# Patient Record
Sex: Female | Born: 1960
Health system: Southern US, Community
[De-identification: ages and names within clinical notes are randomized; demographics above are authoritative.]

## PROBLEM LIST (undated history)

## (undated) DIAGNOSIS — F1021 Alcohol dependence, in remission: Secondary | ICD-10-CM

## (undated) DIAGNOSIS — M797 Fibromyalgia: Secondary | ICD-10-CM

## (undated) HISTORY — DX: Fibromyalgia: M79.7

## (undated) HISTORY — DX: Alcohol dependence, in remission: F10.21

---

## 1998-07-10 ENCOUNTER — Ambulatory Visit (HOSPITAL_COMMUNITY): Admission: RE | Admit: 1998-07-10 | Discharge: 1998-07-10 | Payer: Self-pay | Admitting: Family Medicine

## 1998-07-10 ENCOUNTER — Encounter: Payer: Self-pay | Admitting: Family Medicine

## 2000-02-28 ENCOUNTER — Encounter: Admission: RE | Admit: 2000-02-28 | Discharge: 2000-02-28 | Payer: Self-pay | Admitting: Family Medicine

## 2000-02-28 ENCOUNTER — Encounter: Payer: Self-pay | Admitting: Family Medicine

## 2000-04-03 ENCOUNTER — Encounter: Payer: Self-pay | Admitting: Family Medicine

## 2000-04-03 ENCOUNTER — Encounter: Admission: RE | Admit: 2000-04-03 | Discharge: 2000-04-03 | Payer: Self-pay | Admitting: Family Medicine

## 2004-12-29 ENCOUNTER — Other Ambulatory Visit: Admission: RE | Admit: 2004-12-29 | Discharge: 2004-12-29 | Payer: Self-pay | Admitting: Family Medicine

## 2007-01-22 ENCOUNTER — Other Ambulatory Visit: Admission: RE | Admit: 2007-01-22 | Discharge: 2007-01-22 | Payer: Self-pay | Admitting: Family Medicine

## 2015-02-11 ENCOUNTER — Ambulatory Visit (INDEPENDENT_AMBULATORY_CARE_PROVIDER_SITE_OTHER): Payer: 59 | Admitting: Family Medicine

## 2015-02-11 VITALS — BP 92/62 | HR 81 | Temp 98.4°F | Resp 16 | Ht 60.0 in | Wt 105.8 lb

## 2015-02-11 DIAGNOSIS — B029 Zoster without complications: Secondary | ICD-10-CM | POA: Diagnosis not present

## 2015-02-11 MED ORDER — VALACYCLOVIR HCL 1 G PO TABS: 1000.0000 mg | ORAL_TABLET | Freq: Three times a day (TID) | ORAL | Status: DC

## 2015-02-11 MED ORDER — GABAPENTIN 300 MG PO CAPS: 300.0000 mg | ORAL_CAPSULE | Freq: Three times a day (TID) | ORAL | Status: DC | PRN

## 2015-02-11 MED ORDER — PREDNISONE 20 MG PO TABS: ORAL_TABLET | ORAL | Status: DC

## 2015-02-11 NOTE — Progress Notes (Addendum)
Subjective:  This chart was scribed for Ethelda Chick, MD, MD by Andrew Au, ED Scribe. This patient was seen in room 2 and the patient's care was started at 4:39 PM.   Patient ID: Monique Johnston, female    DOB: August 27, 1960, 54 y.o.   MRN: [id number]  HPI   Chief Complaint  Patient presents with  . Rash    back, since monday   HPI Comments: Monique Johnston is a 54 y.o. female who presents to the [facility] complaining of a rash on her upper back for 3 days. Pt reports 2 days prior to developing the rash she had some soreness to her upper back. She describes the rash to be painful with itchiness, burning and tingling but is not in pain at this time. Pt has not treated rash with OTC medication.  She has been stressed but is emotionally stable. Pt has had exposure to shingle from a coworker at work. Pt lives with her 85 y.o. Mother; pt is supposed to meet with a pregnant women tomorrow.    There are no active problems to display for this patient.  Past Medical History  Diagnosis Date  . Fibromyalgia   . Alcoholism in remission    Prior to Admission medications   Not on File  History reviewed. No pertinent past surgical history. No Known Allergies Social History   Social History  . Marital Status: Married    Spouse Name: N/A  . Number of Children: N/A  . Years of Education: N/A   Occupational History  . Not on file.   Social History Main Topics  . Smoking status: Never Smoker   . Smokeless tobacco: Not on file  . Alcohol Use: No  . Drug Use: Not on file  . Sexual Activity: Not on file   Other Topics Concern  . Not on file   Social History Narrative  . No narrative on file   Family History  Problem Relation Age of Onset  . Cancer Father   . Cancer Sister     Review of Systems  Constitutional: Positive for chills, diaphoresis and fatigue. Negative for fever.  Skin: Positive for color change and rash.  Neurological: Positive for headaches. Negative  for dizziness and light-headedness.   Objective:   Physical Exam  Constitutional: She is oriented to person, place, and time. She appears well-developed and well-nourished. No distress.  HENT:  Head: Normocephalic and atraumatic.  Eyes: Conjunctivae and EOM are normal.  Neck: Neck supple.  Cardiovascular: Normal rate and regular rhythm.   No murmur heard. Pulmonary/Chest: Effort normal and breath sounds normal. No respiratory distress. She has no wheezes. She has no rales.  Musculoskeletal: Normal range of motion.  Neurological: She is alert and oriented to person, place, and time.  Skin: Skin is warm and dry. Rash noted. Rash is vesicular. She is not diaphoretic.  Vesicular rash in cluster on thoracic spine that starts on the right thoracic region and extends to right breast.   Psychiatric: She has a normal mood and affect. Her behavior is normal.  Nursing note and vitals reviewed.  Filed Vitals:   02/11/15 1608  BP: 92/62  Pulse: 81  Temp: 98.4 F (36.9 C)  TempSrc: Oral  Resp: 16  Height: 5' (1.524 m)  Weight: 105 lb 12.8 oz (47.991 kg)  SpO2: 99%   Assessment & Plan:   1. Herpes zoster    -New. -Rx for Valtrex tid, Prednisone provided. -Rx  for Neurontin 300mg  tid PRN for pain.   No orders of the defined types were placed in this encounter.    Meds ordered this encounter  Medications  . valACYclovir (VALTREX) 1000 MG tablet    Sig: Take 1 tablet (1,000 mg total) by mouth 3 (three) times daily.    Dispense:  30 tablet    Refill:  0  . predniSONE (DELTASONE) 20 MG tablet    Sig: Two tablets daily x 5 days then one tablet daily x 5 days.    Dispense:  15 tablet    Refill:  0  . gabapentin (NEURONTIN) 300 MG capsule    Sig: Take 1 capsule (300 mg total) by mouth 3 (three) times daily as needed.    Dispense:  60 capsule    Refill:  1    I personally performed the services described in this documentation, which was scribed in my presence. The recorded  information has been reviewed and considered.  Kristi Paulita Fujita, M.D. [facility] [address] [place], [place]  [address] [phone] phone [phone] fax

## 2015-02-11 NOTE — Patient Instructions (Signed)
Shingles Shingles (herpes zoster) is an infection that is caused by the same virus that causes chickenpox (varicella). The infection causes a painful skin rash and fluid-filled blisters, which eventually break open, crust over, and heal. It Heiland occur in any area of the body, but it usually affects only one side of the body or face. The pain of shingles usually lasts about 1 month. However, some people with shingles Maclachlan develop long-term (chronic) pain in the affected area of the body. Shingles often occurs many years after the person had chickenpox. It is more common:  In people older than 50 years.  In people with weakened immune systems, such as those with HIV, AIDS, or cancer.  In people taking medicines that weaken the immune system, such as transplant medicines.  In people under great stress. CAUSES  Shingles is caused by the varicella zoster virus (VZV), which also causes chickenpox. After a person is infected with the virus, it can remain in the person's body for years in an inactive state (dormant). To cause shingles, the virus reactivates and breaks out as an infection in a nerve root. The virus can be spread from person to person (contagious) through contact with open blisters of the shingles rash. It will only spread to people who have not had chickenpox. When these people are exposed to the virus, they Ozga develop chickenpox. They will not develop shingles. Once the blisters scab over, the person is no longer contagious and cannot spread the virus to others. SIGNS AND SYMPTOMS  Shingles shows up in stages. The initial symptoms Burdin be pain, itching, and tingling in an area of the skin. This pain is usually described as burning, stabbing, or throbbing.In a few days or weeks, a painful red rash will appear in the area where the pain, itching, and tingling were felt. The rash is usually on one side of the body in a band or belt-like pattern. Then, the rash usually turns into fluid-filled  blisters. They will scab over and dry up in approximately 2-3 weeks. Flu-like symptoms Michael also occur with the initial symptoms, the rash, or the blisters. These Hoffert include:  Fever.  Chills.  Headache.  Upset stomach. DIAGNOSIS  Your health care provider will perform a skin exam to diagnose shingles. Skin scrapings or fluid samples Ching also be taken from the blisters. This sample will be examined under a microscope or sent to a lab for further testing. TREATMENT  There is no specific cure for shingles. Your health care provider will likely prescribe medicines to help you manage the pain, recover faster, and avoid long-term problems. This [place] include antiviral drugs, anti-inflammatory drugs, and pain medicines. HOME CARE INSTRUCTIONS   Take a cool bath or apply cool compresses to the area of the rash or blisters as directed. This Blades help with the pain and itching.   Take medicines only as directed by your health care provider.   Rest as directed by your health care provider.  Keep your rash and blisters clean with mild soap and cool water or as directed by your health care provider.  Do not pick your blisters or scratch your rash. Apply an anti-itch cream or numbing creams to the affected area as directed by your health care provider.  Keep your shingles rash covered with a loose bandage (dressing).  Avoid skin contact with:  Babies.   Pregnant women.   Children with eczema.   Elderly people with transplants.   People with chronic illnesses, such as leukemia   or AIDS.   Wear loose-fitting clothing to help ease the pain of material rubbing against the rash.  Keep all follow-up visits as directed by your health care provider.If the area involved is on your face, you Zumbro receive a referral for a specialist, such as an eye doctor (ophthalmologist) or an ear, nose, and throat (ENT) doctor. Keeping all follow-up visits will help you avoid eye problems, chronic pain, or  disability.  SEEK IMMEDIATE MEDICAL CARE IF:   You have facial pain, pain around the eye area, or loss of feeling on one side of your face.  You have ear pain or ringing in your ear.  You have loss of taste.  Your pain is not relieved with prescribed medicines.   Your redness or swelling spreads.   You have more pain and swelling.  Your condition is worsening or has changed.   You have a fever. MAKE SURE YOU:  Understand these instructions.  Will watch your condition.  Will get help right away if you are not doing well or get worse. Document Released: 05/16/2005 Document Revised: 09/30/2013 Document Reviewed: 12/29/2011 ExitCare Patient Information 2015 [employer]. This information is not intended to replace advice given to you by your health care provider. Make sure you discuss any questions you have with your health care provider.  

## 2015-05-13 ENCOUNTER — Ambulatory Visit (INDEPENDENT_AMBULATORY_CARE_PROVIDER_SITE_OTHER): Payer: 59 | Admitting: Emergency Medicine

## 2015-05-13 VITALS — BP 120/76 | HR 74 | Temp 97.9°F | Resp 20 | Ht 62.0 in | Wt 106.4 lb

## 2015-05-13 DIAGNOSIS — A084 Viral intestinal infection, unspecified: Secondary | ICD-10-CM | POA: Diagnosis not present

## 2015-05-13 DIAGNOSIS — J209 Acute bronchitis, unspecified: Secondary | ICD-10-CM

## 2015-05-13 MED ORDER — ONDANSETRON 8 MG PO TBDP: 8.0000 mg | ORAL_TABLET | Freq: Three times a day (TID) | ORAL | Status: DC | PRN

## 2015-05-13 MED ORDER — AZITHROMYCIN 250 MG PO TABS: ORAL_TABLET | ORAL | Status: DC

## 2015-05-13 NOTE — Patient Instructions (Signed)
Acute Bronchitis Bronchitis is inflammation of the airways that extend from the windpipe into the lungs (bronchi). The inflammation often causes mucus to develop. This leads to a cough, which is the most common symptom of bronchitis.  In acute bronchitis, the condition usually develops suddenly and goes away over time, usually in a couple weeks. Smoking, allergies, and asthma can make bronchitis worse. Repeated episodes of bronchitis Pinela cause further lung problems.  CAUSES Acute bronchitis is most often caused by the same virus that causes a cold. The virus can spread from person to person (contagious) through coughing, sneezing, and touching contaminated objects. SIGNS AND SYMPTOMS   Cough.   Fever.   Coughing up mucus.   Body aches.   Chest congestion.   Chills.   Shortness of breath.   Sore throat.  DIAGNOSIS  Acute bronchitis is usually diagnosed through a physical exam. Your health care provider will also ask you questions about your medical history. Tests, such as chest X-rays, are sometimes done to rule out other conditions.  TREATMENT  Acute bronchitis usually goes away in a couple weeks. Oftentimes, no medical treatment is necessary. Medicines are sometimes given for relief of fever or cough. Antibiotic medicines are usually not needed but Berns be prescribed in certain situations. In some cases, an inhaler Ardizzone be recommended to help reduce shortness of breath and control the cough. A cool mist vaporizer Clubb also be used to help thin bronchial secretions and make it easier to clear the chest.  HOME CARE INSTRUCTIONS  Get plenty of rest.   Drink enough fluids to keep your urine clear or pale yellow (unless you have a medical condition that requires fluid restriction). Increasing fluids Gornick help thin your respiratory secretions (sputum) and reduce chest congestion, and it will prevent dehydration.   Take medicines only as directed by your health care provider.  If  you were prescribed an antibiotic medicine, finish it all even if you start to feel better.  Avoid smoking and secondhand smoke. Exposure to cigarette smoke or irritating chemicals will make bronchitis worse. If you are a smoker, consider using nicotine gum or skin patches to help control withdrawal symptoms. Quitting smoking will help your lungs heal faster.   Reduce the chances of another bout of acute bronchitis by washing your hands frequently, avoiding people with cold symptoms, and trying not to touch your hands to your mouth, nose, or eyes.   Keep all follow-up visits as directed by your health care provider.  SEEK MEDICAL CARE IF: Your symptoms do not improve after 1 week of treatment.  SEEK IMMEDIATE MEDICAL CARE IF:  You develop an increased fever or chills.   You have chest pain.   You have severe shortness of breath.  You have bloody sputum.   You develop dehydration.  You faint or repeatedly feel like you are going to pass out.  You develop repeated vomiting.  You develop a severe headache. MAKE SURE YOU:   Understand these instructions.  Will watch your condition.  Will get help right away if you are not doing well or get worse.   This information is not intended to replace advice given to you by your health care provider. Make sure you discuss any questions you have with your health care provider.   Document Released: 06/23/2004 Document Revised: 06/06/2014 Document Reviewed: 11/06/2012 Elsevier Interactive Patient Education 2016 Elsevier Inc.  

## 2015-05-13 NOTE — Progress Notes (Signed)
Subjective:  Patient ID: Monique CorpusSusan T Schweitzer, female    DOB: 12-18-1960  Age: 54 y.o. MRN: [id number]  CC: Herpes Zoster and Depression   HPI Monique CorpusSusan T Voytko presents  with numerous complaints. She says in September she had shingles and is concerned that his shingles is reduced her level of resistance and she's been sick repeatedly since that time. She had a "influenza." Followed by a "cold". Should she then over the last 24 hours had nausea vomiting diarrhea which is currently resolved. Low back pain. Nonradiating. No no radiation of pain numbness tingling or weakness. She has no rash.  History Darl PikesSusan has a past medical history of Fibromyalgia and Alcoholism in remission (HCC).   She has no past surgical history on file.   Her  family history includes Cancer in her father and sister.  She   reports that she has never smoked. She does not have any smokeless tobacco history on file. She reports that she does not drink alcohol. Her drug history is not on file.  Outpatient Prescriptions Prior to Visit  Medication Sig Dispense Refill  . gabapentin (NEURONTIN) 300 MG capsule Take 1 capsule (300 mg total) by mouth 3 (three) times daily as needed. 60 capsule 1  . predniSONE (DELTASONE) 20 MG tablet Two tablets daily x 5 days then one tablet daily x 5 days. (Patient not taking: Reported on 05/13/2015) 15 tablet 0  . valACYclovir (VALTREX) 1000 MG tablet Take 1 tablet (1,000 mg total) by mouth 3 (three) times daily. (Patient not taking: Reported on 05/13/2015) 30 tablet 0   No facility-administered medications prior to visit.    Social History   Social History  . Marital Status: Married    Spouse Name: N/A  . Number of Children: N/A  . Years of Education: N/A   Social History Main Topics  . Smoking status: Never Smoker   . Smokeless tobacco: None  . Alcohol Use: No  . Drug Use: None  . Sexual Activity: Not Asked   Other Topics Concern  . None   Social History Narrative     Review of  Systems  Constitutional: Positive for fever, chills and appetite change.  HENT: Positive for postnasal drip and rhinorrhea. Negative for congestion, ear pain, sinus pressure and sore throat.   Eyes: Negative for pain and redness.  Respiratory: Positive for cough. Negative for shortness of breath and wheezing.   Cardiovascular: Negative for leg swelling.  Gastrointestinal: Positive for nausea and diarrhea. Negative for vomiting, abdominal pain, constipation and blood in stool.  Endocrine: Negative for polyuria.  Genitourinary: Negative for dysuria, urgency, frequency and flank pain.  Musculoskeletal: Negative for gait problem.  Skin: Negative for rash.  Neurological: Negative for weakness and headaches.  Psychiatric/Behavioral: Negative for confusion and decreased concentration. The patient is not nervous/anxious.     Objective:  BP 120/76 mmHg  Pulse 74  Temp(Src) 97.9 F (36.6 C) (Oral)  Resp 20  Ht 5\' 2"  (1.575 m)  Wt 106 lb 6.4 oz (48.263 kg)  BMI 19.46 kg/m2  SpO2 97%  Physical Exam  Constitutional: She is oriented to person, place, and time. She appears well-developed and well-nourished. No distress.  HENT:  Head: Normocephalic and atraumatic.  Right Ear: External ear normal.  Left Ear: External ear normal.  Nose: Nose normal.  Eyes: Conjunctivae and EOM are normal. Pupils are equal, round, and reactive to light. No scleral icterus.  Neck: Normal range of motion. Neck supple. No tracheal deviation present.  Cardiovascular:  Normal rate, regular rhythm and normal heart sounds.   Pulmonary/Chest: Effort normal. No respiratory distress. She has no wheezes. She has no rales.  Abdominal: She exhibits no mass. There is no tenderness. There is no rebound and no guarding.  Musculoskeletal: She exhibits no edema.  Lymphadenopathy:    She has no cervical adenopathy.  Neurological: She is alert and oriented to person, place, and time. Coordination normal.  Skin: Skin is warm and  dry. No rash noted.  Psychiatric: She has a normal mood and affect. Her behavior is normal.      Assessment & Plan:   Alixandrea was seen today for herpes zoster and depression.  Diagnoses and all orders for this visit:  Acute bronchitis, unspecified organism  Viral gastroenteritis  Other orders -     azithromycin (ZITHROMAX) 250 MG tablet; Take 2 tabs PO x 1 dose, then 1 tab PO QD x 4 days -     ondansetron (ZOFRAN-ODT) 8 MG disintegrating tablet; Take 1 tablet (8 mg total) by mouth every 8 (eight) hours as needed for nausea.  I am having Ms. Burger start on azithromycin and ondansetron. I am also having her maintain her valACYclovir, predniSONE, and gabapentin.  Meds ordered this encounter  Medications  . azithromycin (ZITHROMAX) 250 MG tablet    Sig: Take 2 tabs PO x 1 dose, then 1 tab PO QD x 4 days    Dispense:  6 tablet    Refill:  0  . ondansetron (ZOFRAN-ODT) 8 MG disintegrating tablet    Sig: Take 1 tablet (8 mg total) by mouth every 8 (eight) hours as needed for nausea.    Dispense:  30 tablet    Refill:  0    Appropriate red flag conditions were discussed with the patient as well as actions that should be taken.  Patient expressed his understanding.  Follow-up: Return if symptoms worsen or fail to improve.  Carmelina Dane, MD

## 2016-02-23 ENCOUNTER — Ambulatory Visit (INDEPENDENT_AMBULATORY_CARE_PROVIDER_SITE_OTHER): Payer: BLUE CROSS/BLUE SHIELD | Admitting: Family Medicine

## 2016-02-23 VITALS — BP 94/62 | HR 66 | Temp 98.5°F | Resp 17 | Ht 62.0 in | Wt 109.0 lb

## 2016-02-23 DIAGNOSIS — Z1321 Encounter for screening for nutritional disorder: Secondary | ICD-10-CM

## 2016-02-23 DIAGNOSIS — Z0000 Encounter for general adult medical examination without abnormal findings: Secondary | ICD-10-CM | POA: Diagnosis not present

## 2016-02-23 DIAGNOSIS — Z114 Encounter for screening for human immunodeficiency virus [HIV]: Secondary | ICD-10-CM

## 2016-02-23 DIAGNOSIS — Z131 Encounter for screening for diabetes mellitus: Secondary | ICD-10-CM | POA: Diagnosis not present

## 2016-02-23 DIAGNOSIS — Z1329 Encounter for screening for other suspected endocrine disorder: Secondary | ICD-10-CM

## 2016-02-23 DIAGNOSIS — Z136 Encounter for screening for cardiovascular disorders: Secondary | ICD-10-CM

## 2016-02-23 DIAGNOSIS — Z01419 Encounter for gynecological examination (general) (routine) without abnormal findings: Secondary | ICD-10-CM

## 2016-02-23 DIAGNOSIS — Z1159 Encounter for screening for other viral diseases: Secondary | ICD-10-CM | POA: Diagnosis not present

## 2016-02-23 LAB — POCT URINALYSIS DIP (MANUAL ENTRY)
Bilirubin, UA: NEGATIVE
Glucose, UA: NEGATIVE
Ketones, POC UA: NEGATIVE
NITRITE UA: NEGATIVE
PH UA: 7
PROTEIN UA: NEGATIVE
Spec Grav, UA: 1.01
Urobilinogen, UA: 0.2

## 2016-02-23 LAB — CBC WITH DIFFERENTIAL/PLATELET
BASOS ABS: 41 {cells}/uL (ref 0–200)
Basophils Relative: 1 %
EOS PCT: 2 %
Eosinophils Absolute: 82 cells/uL (ref 15–500)
HCT: 35.7 % (ref 35.0–45.0)
Hemoglobin: 12 g/dL (ref 11.7–15.5)
LYMPHS ABS: 1353 {cells}/uL (ref 850–3900)
Lymphocytes Relative: 33 %
MCH: 29.4 pg (ref 27.0–33.0)
MCHC: 33.6 g/dL (ref 32.0–36.0)
MCV: 87.5 fL (ref 80.0–100.0)
MONOS PCT: 15 %
MPV: 8.5 fL (ref 7.5–12.5)
Monocytes Absolute: 615 cells/uL (ref 200–950)
NEUTROS PCT: 49 %
Neutro Abs: 2009 cells/uL (ref 1500–7800)
PLATELETS: 289 10*3/uL (ref 140–400)
RBC: 4.08 MIL/uL (ref 3.80–5.10)
RDW: 14.2 % (ref 11.0–15.0)
WBC: 4.1 10*3/uL (ref 3.8–10.8)

## 2016-02-23 LAB — COMPLETE METABOLIC PANEL WITH GFR
ALBUMIN: 4.4 g/dL (ref 3.6–5.1)
ALK PHOS: 60 U/L (ref 33–130)
ALT: 19 U/L (ref 6–29)
AST: 21 U/L (ref 10–35)
BILIRUBIN TOTAL: 0.5 mg/dL (ref 0.2–1.2)
BUN: 10 mg/dL (ref 7–25)
CALCIUM: 9.5 mg/dL (ref 8.6–10.4)
CO2: 25 mmol/L (ref 20–31)
Chloride: 104 mmol/L (ref 98–110)
Creat: 0.62 mg/dL (ref 0.50–1.05)
GFR, Est African American: >89 mL/min (ref >=60)
GLUCOSE: 86 mg/dL (ref 65–99)
POTASSIUM: 4.6 mmol/L (ref 3.5–5.3)
SODIUM: 138 mmol/L (ref 135–146)
TOTAL PROTEIN: 6.8 g/dL (ref 6.1–8.1)

## 2016-02-23 LAB — LIPID PANEL
Cholesterol: 207 mg/dL — ABNORMAL HIGH (ref 125–200)
HDL: 93 mg/dL (ref >=46)
LDL CALC: 105 mg/dL (ref <130)
Total CHOL/HDL Ratio: 2.2 Ratio (ref <=5.0)
Triglycerides: 47 mg/dL (ref <150)
VLDL: 9 mg/dL (ref <30)

## 2016-02-23 LAB — POC MICROSCOPIC URINALYSIS (UMFC): Mucus: ABSENT

## 2016-02-23 LAB — HEPATITIS C ANTIBODY: HCV AB: NEGATIVE

## 2016-02-23 LAB — HIV ANTIBODY (ROUTINE TESTING W REFLEX): HIV: NONREACTIVE

## 2016-02-23 LAB — TSH: TSH: 1.12 m[IU]/L

## 2016-02-23 NOTE — Patient Instructions (Addendum)
It was nice meeting you today!  As we discussed today, please schedule your mammogram utilizing the contact information below. Please follow-up with me regarding ordering the Cologuard screening for colon cancer. You are due for a TDAP. We are out of these vaccinations today so feel free to return to the office at your convenience to obtain this vaccination. Unless additional care is needed, you should return in 12 months for another physical exam. I will follow-up with you regarding your lab results.   Monique Johnston. Tiburcio Pea, MSN, FNP-C [facility]   IF you received an x-ray today, you will receive an invoice from [facility] Radiology. Please contact [facility]iology at [phone] with questions or concerns regarding your invoice.   IF you received labwork today, you will receive an invoice from [employer]. Please contact [facility] at [phone] with questions or concerns regarding your invoice.   Our billing staff will not be able to assist you with questions regarding bills from these companies.  You will be contacted with the lab results as soon as they are available. The fastest way to get your results is to activate your My Chart account. Instructions are located on the last page of this paperwork. If you have not heard from [place] regarding the results in 2 weeks, please contact this office.    We recommend that you schedule a mammogram for breast cancer screening. Typically, you do not need a referral to do this. Please contact a local imaging center to schedule your mammogram.  [facility] - [phone]  *ask for the Radiology Department [facility]) - [phone] or [phone]  [facility] - [phone] [facility] - [phone] [facility]  - [phone]  *ask for the Radiology Department [facility] - [phone]  *ask for the Radiology Department [facility] - [phone]  *ask for the Mammography Department [facility] - [phone]   Exercising to Stay Healthy Exercising regularly is important. It has many health benefits, such as:  Improving your overall fitness, flexibility, and endurance.  Increasing your bone density.  Helping with weight control.  Decreasing your body fat.  Increasing your muscle strength.  Reducing stress and tension.  Improving your overall health. In order to become healthy and stay healthy, it is recommended that you do moderate-intensity and vigorous-intensity exercise. You can tell that you are exercising at a moderate intensity if you have a higher heart rate and faster breathing, but you are still able to hold a conversation. You can tell that you are exercising at a vigorous intensity if you are breathing much harder and faster and cannot hold a conversation while exercising. HOW OFTEN SHOULD I EXERCISE? Choose an activity that you enjoy and set realistic goals. Your health care provider can help you to make an activity plan that works for you. Exercise regularly as directed by your health care provider. This [place] include:   Doing resistance training twice each week, such as:  Push-ups.  Sit-ups.  Lifting weights.  Using resistance bands.  Doing a given intensity of exercise for a given amount of time. Choose from these options:  150 minutes of moderate-intensity exercise every week.  75 minutes of vigorous-intensity exercise every week.  A mix of moderate-intensity and vigorous-intensity exercise every week. Children, pregnant women, people who are out of shape, people who are overweight, and older adults [place] need to  consult a health care provider for individual recommendations. If you have any sort of medical condition, be sure to consult your health care provider before starting a new exercise program.  WHAT ARE SOME  EXERCISE IDEAS? Some moderate-intensity exercise ideas include:   Walking at a rate of 1 mile in 15 minutes.  Biking.  Hiking.  Golfing.  Dancing. Some vigorous-intensity exercise ideas include:   Walking at a rate of at least 4.5 miles per hour.  Jogging or running at a rate of 5 miles per hour.  Biking at a rate of at least 10 miles per hour.  Lap swimming.  Roller-skating or in-line skating.  Cross-country skiing.  Vigorous competitive sports, such as football, basketball, and soccer.  Jumping rope.  Aerobic dancing. WHAT ARE SOME EVERYDAY ACTIVITIES THAT CAN HELP ME TO GET EXERCISE?  Yard work, such as:  Child psychotherapistushing a lawn mower.  Raking and bagging leaves.  Washing and waxing your car.  Pushing a stroller.  Shoveling snow.  Gardening.  Washing windows or floors. HOW CAN I BE MORE ACTIVE IN MY DAY-TO-DAY ACTIVITIES?  Use the stairs instead of the elevator.  Take a walk during your lunch break.  If you drive, park your car farther away from work or school.  If you take public transportation, get off one stop early and walk the rest of the way.  Make all of your phone calls while standing up and walking around.  Get up, stretch, and walk around every 30 minutes throughout the day. WHAT GUIDELINES SHOULD I FOLLOW WHILE EXERCISING?  Do not exercise so much that you hurt yourself, feel dizzy, or get very short of breath.  Consult your health care provider before starting a new exercise program.  Wear comfortable clothes and shoes with good support.  Drink plenty of water while you exercise to prevent dehydration or heat stroke. Body water is lost during exercise and must be replaced.  Work out until you breathe faster and your heart beats faster.   This information is not intended to replace advice given to you by your health care provider. Make sure you discuss any questions you have with your health care provider.   Document Released: 06/18/2010  Document Revised: 06/06/2014 Document Reviewed: 10/17/2013 Elsevier Interactive Patient Education [employer]2016 Elsevier Inc.

## 2016-02-23 NOTE — Progress Notes (Signed)
Patient ID: Hoa Briggs Cendejas, female    DOB: 03/28/1961, 55 y.o.   MRN: [id number]  PCP: No primary care provider on file.  Chief Complaint  Patient presents with  . Annual Exam    Subjective:   HPI 55 year old presents for a complete physical exam with no acute health concerns.She reports overall good health. Her family history is positive for her father developing lung/lymphoma cancer (heavy smoker) and passing away at age 56.  Her mother is healthy and still alive. 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    HEENT Reports regular annual eye exams with no acute visual problems. Regular and recent dental cleanings. Reports no swallowing difficulties. No tinnitus or hearing loss.  Cardiovascular /Lungs Reports no palpitations, chest pain, or cough. No regular exercise.  Reports she walks for exercise occasionally but not routinely.   Breasts Last mammogram was in 2001. No breast pain or lumps. Aware that she is overdue for mammogram.  Abdominal Denies abdominal pain, pretty regular diet.  Drink lots of water. Eats 2-3 meals and snacks.  Genitourinary  No problems with urinary frequency,  No vaginal   Musculoskeletal  Negative   Neurological  No headaches, no family hx CVA, no dizziness  Psychological Health  No anxiety and depression   Review of Systems HPI   There are no active problems to display for this patient.    Prior to Admission medications   Not on File     No Known Allergies     Objective:  Physical Exam  Constitutional: She is oriented to person, place, and time. She appears well-developed and well-nourished.  HENT:  Head: Normocephalic and atraumatic.  Right Ear: External ear normal.  Left Ear: External ear normal.  Nose: Nose normal.  Mouth/Throat: Oropharynx is clear and moist.  Eyes: Conjunctivae and EOM are normal. Pupils are equal, round, and reactive to light.    Neck: Normal range of motion. Neck supple. No tracheal deviation present. No thyromegaly present.  Cardiovascular: Normal rate, regular rhythm, normal heart sounds and intact distal pulses.   Pulmonary/Chest: Effort normal and breath sounds normal.  Abdominal: Bowel sounds are normal.  Musculoskeletal: Normal range of motion.  Lymphadenopathy:    She has no cervical adenopathy.  Neurological: She is alert and oriented to person, place, and time. She has normal reflexes.  Skin: Skin is warm and dry.  Psychiatric: She has a normal mood and affect. Her behavior is normal. Judgment and thought content normal.     Vitals:   02/23/16 0815  BP: 94/62  Pulse: 66  Resp: 17  Temp: 98.5 F (36.9 C)    Assessment & Plan:  1. Annual physical exam Age-appropriate anticipatory guidance provided Colonoscopy-Patient will consider Cologuard. She is not interested in traditional colonoscopy at present. Requested TDAP-clinic currently out of vaccine.  2. Encounter for routine gynecological examination - POCT urinalysis dipstick - POCT Microscopic Urinalysis ([facility]) - Pap IG and HPV (high risk) DNA detection  3. Screening for diabetes mellitus - COMPLETE METABOLIC PANEL WITH GFR  4. Screening for HIV (human immunodeficiency virus) - HIV antibody (with reflex)  5. Need for hepatitis C screening test - Hepatitis C antibody  6. Screening for cardiovascular condition - Lipid panel  7. Screening for thyroid disorder - TSH  8. Encounter for vitamin deficiency screening - CBC with Differential/Platelet - VITAMIN D 25 Hydroxy (Vit-D Deficiency, Fractures)  Follow-up in 12 months for  annual exam or sooner if you require care.  Will follow-up with you regarding your lab results.  Godfrey PickKimberly S. Tiburcio PeaHarris, MSN, FNP-C [facility]

## 2016-02-24 ENCOUNTER — Encounter: Payer: Self-pay | Admitting: Family Medicine

## 2016-02-24 LAB — VITAMIN D 25 HYDROXY (VIT D DEFICIENCY, FRACTURES): VIT D 25 HYDROXY: 39 ng/mL (ref 30–100)

## 2016-02-24 LAB — PAP IG AND HPV HIGH-RISK: HPV DNA High Risk: NOT DETECTED

## 2016-02-24 NOTE — Progress Notes (Signed)
February 24, 2016   Monique Johnston [address] [place] [address]   Dear [name],  Below are the results from your recent visit and your results were as expected.  Resulted Orders  COMPLETE METABOLIC PANEL WITH GFR  Result Value Ref Range   Sodium 138 135 - 146 mmol/L   Potassium 4.6 3.5 - 5.3 mmol/L   Chloride 104 98 - 110 mmol/L   CO2 25 20 - 31 mmol/L   Glucose, Bld 86 65 - 99 mg/dL   BUN 10 7 - 25 mg/dL   Creat 0.62 0.50 - 1.05 mg/dL     Comment:       For patients > or = 56 years of age: The upper reference limit for Creatinine is approximately 13% higher for people identified as African-American.      Total Bilirubin 0.5 0.2 - 1.2 mg/dL   Alkaline Phosphatase 60 33 - 130 U/L   AST 21 10 - 35 U/L   ALT 19 6 - 29 U/L   Total Protein 6.8 6.1 - 8.1 g/dL   Albumin 4.4 3.6 - 5.1 g/dL   Calcium 9.5 8.6 - 10.4 mg/dL   GFR, Est African American >89 >=60 mL/min   GFR, Est Non African American >89 >=60 mL/min   Narrative   Performed at:  [facility]                [address], [address]                [place], [place] [address]  CBC with Differential/Platelet  Result Value Ref Range   WBC 4.1 3.8 - 10.8 K/uL   RBC 4.08 3.80 - 5.10 MIL/uL   Hemoglobin 12.0 11.7 - 15.5 g/dL   HCT 35.7 35.0 - 45.0 %   MCV 87.5 80.0 - 100.0 fL   MCH 29.4 27.0 - 33.0 pg   MCHC 33.6 32.0 - 36.0 g/dL   RDW 14.2 11.0 - 15.0 %   Platelets 289 140 - 400 K/uL   MPV 8.5 7.5 - 12.5 fL   Neutro Abs 2,009 1,500 - 7,800 cells/uL   Lymphs Abs 1,353 850 - 3,900 cells/uL   Monocytes Absolute 615 200 - 950 cells/uL   Eosinophils Absolute 82 15 - 500 cells/uL   Basophils Absolute 41 0 - 200 cells/uL   Neutrophils Relative % 49 %   Lymphocytes Relative 33 %   Monocytes Relative 15 %   Eosinophils Relative 2 %   Basophils Relative 1 %   Smear Review Criteria for review not met    Narrative   Performed at:  [address], [address]             [place], [place] [address]  HIV antibody (with reflex)  Result Value Ref Range   HIV 1&2 Ab, 4th Generation NONREACTIVE NONREACTIVE     Comment:       HIV-1 antigen and HIV-1/HIV-2 antibodies were not detected.  There is no laboratory evidence of HIV infection.   HIV-1/2 Antibody Diff        Not indicated. HIV-1 RNA, Qual TMA          Not indicated.     PLEASE NOTE: This information has been disclosed to you from records whose confidentiality Neyer be protected by state law. If your state requires such protection, then the state law  prohibits you from making any further disclosure of the information without the specific written consent of the person to whom it pertains, or as otherwise permitted by law. A general authorization for the release of medical or other information is NOT sufficient for this purpose.   The performance of this assay has not been clinically validated in patients less than 43 years old.   For additional information please refer to http://education.questdiagnostics.com/faq/FAQ106.  (This link is being provided for informational/educational purposes only.)      Narrative   Performed at:  [facility]                [address]                [place], [place] [address]  Hepatitis C antibody  Result Value Ref Range   HCV Ab NEGATIVE NEGATIVE   Narrative   Performed at:  [address], [address]                [place], [place] [address]  Lipid panel  Result Value Ref Range   Cholesterol 207 (H) 125 - 200 mg/dL   Triglycerides 47 <150 mg/dL   HDL 93 >=46 mg/dL   Total CHOL/HDL Ratio 2.2 <=5.0 Ratio   VLDL 9 <30 mg/dL   LDL Cholesterol 105 <130 mg/dL     Comment:       Total Cholesterol/HDL Ratio:CHD Risk                        Coronary Heart Disease Risk Table                                        Men       Women          1/2 Average Risk              3.4        3.3              Average  Risk              5.0        4.4           2X Average Risk              9.6        7.1           3X Average Risk             23.4       11.0 Use the calculated Patient Ratio above and the CHD Risk table  to determine the patient's CHD Risk.    Narrative   Performed at:  [facility]                [address]                [place], [place] [address]  TSH  Result Value Ref Range   TSH 1.12 mIU/L     Comment:       Reference Range   > or = 20 Years  0.40-4.50   Pregnancy Range First trimester  0.26-2.66 Second trimester 0.55-2.73 Third trimester  0.43-2.91  Narrative   Performed at:  [facility]                [address]                [place], [place] [address]  VITAMIN D 25 Hydroxy (Vit-D Deficiency, Fractures)  Result Value Ref Range   Vit D, 25-Hydroxy 39 30 - 100 ng/mL     Comment:     Vitamin D Status           25-OH Vitamin D        Deficiency                <20 ng/mL        Insufficiency         20 - 29 ng/mL        Optimal             > or = 30 ng/mL   For 25-OH Vitamin D testing on patients on D2-supplementation and patients for whom quantitation of D2 and D3 fractions is required, the QuestAssureD 25-OH VIT D, (D2,D3), LC/MS/MS is recommended: order code [id number] (patients > 2 yrs).    Narrative   Performed at:  [address], [address]                [place], [place] [address]   If you have any questions or concerns, please don't hesitate to call.  Sincerely,   Carroll Sage. Kenton Kingfisher, MSN, FNP-C [facility]

## 2016-03-01 ENCOUNTER — Other Ambulatory Visit: Payer: Self-pay | Admitting: Family Medicine

## 2016-03-01 DIAGNOSIS — Z1231 Encounter for screening mammogram for malignant neoplasm of breast: Secondary | ICD-10-CM

## 2016-03-08 ENCOUNTER — Ambulatory Visit
Admission: RE | Admit: 2016-03-08 | Discharge: 2016-03-08 | Disposition: A | Discharge status: Home / Self Care | Payer: BLUE CROSS/BLUE SHIELD | Source: Ambulatory Visit | Attending: Family Medicine | Admitting: Family Medicine

## 2016-03-08 ENCOUNTER — Telehealth: Payer: Self-pay

## 2016-03-08 DIAGNOSIS — Z1231 Encounter for screening mammogram for malignant neoplasm of breast: Secondary | ICD-10-CM | POA: Diagnosis not present

## 2016-03-08 NOTE — Telephone Encounter (Signed)
Pt has questions about lab results  Best number [phone]

## 2016-03-10 NOTE — Telephone Encounter (Signed)
I was waiting for her urine culture to result but it appears one wasn't ordered. I can't place a future order since these are point of care test. I would like the patient at her convenience to return and supply a urine specimen to ensure the hematuria has resolved.    Thanks,  Joaquin CourtsKimberly Orissa Arreaga FNP-C

## 2016-03-10 NOTE — Telephone Encounter (Signed)
Lmtcb. I do see a normal letter for labs sent but there are urine results that are not addressed? Please advise.

## 2016-03-12 NOTE — Telephone Encounter (Signed)
p 

## 2016-03-14 NOTE — Telephone Encounter (Signed)
LMTRC

## 2016-03-14 NOTE — Telephone Encounter (Signed)
I recommend that the next time she is seen in the office that she have a repeat urinalysis to verify no hematuria present.  Thanks,  Godfrey PickKimberly S. Tiburcio PeaHarris, MSN, FNP-C [facility]

## 2016-06-13 ENCOUNTER — Ambulatory Visit (INDEPENDENT_AMBULATORY_CARE_PROVIDER_SITE_OTHER): Payer: BLUE CROSS/BLUE SHIELD | Admitting: Physician Assistant

## 2016-06-13 ENCOUNTER — Encounter: Payer: Self-pay | Admitting: Physician Assistant

## 2016-06-13 VITALS — BP 108/48 | HR 79 | Temp 98.6°F | Resp 16 | Ht 62.0 in | Wt 112.0 lb

## 2016-06-13 DIAGNOSIS — J069 Acute upper respiratory infection, unspecified: Secondary | ICD-10-CM | POA: Diagnosis not present

## 2016-06-13 LAB — POCT SEDIMENTATION RATE: POCT SED RATE: 28 mm/h — AB (ref 0–22)

## 2016-06-13 MED ORDER — AMOXICILLIN-POT CLAVULANATE 875-125 MG PO TABS: 1.0000 | ORAL_TABLET | Freq: Two times a day (BID) | ORAL | 0 refills | Status: DC

## 2016-06-13 NOTE — Patient Instructions (Addendum)
Try taking tylenol 1000 mg every eight hours as needed.  Consider taking Zyrtec-D (5-120) in the morning.  I will contact you if the sed rate is positive.     IF you received an x-ray today, you will receive an invoice from [facility]. Please contact [facility] at [phone] with questions or concerns regarding your invoice.   IF you received labwork today, you will receive an invoice from [facility]. Please contact [facility] at [phone] with questions or concerns regarding your invoice.   Our billing staff will not be able to assist you with questions regarding bills from these companies.  You will be contacted with the lab results as soon as they are available. The fastest way to get your results is to activate your My Chart account. Instructions are located on the last page of this paperwork. If you have not heard from us regarding the results in 2 weeks, please contact this office.

## 2016-06-13 NOTE — Progress Notes (Signed)
06/13/2016 4:34 PM   DOB: 03-07-1961 / MRN: [id number]  SUBJECTIVE:  Monique Johnston is a 56 y.o. female presenting for an illness that started after Christmas.  Says this illness has been waxing and waning.  Complains of excessive fatigue, mild nausea, nasal congestion, and some sore throat.  She has had HA off and on.  She reports some nausea and diarrhea that came and went last Tuesday and Wednesday. No teeth pain or HA.   She has No Known Allergies.   She  has a past medical history of Alcoholism in remission (HCC) and Fibromyalgia.    She  reports that she has never smoked. She does not have any smokeless tobacco history on file. She reports that she does not drink alcohol. She  has no sexual activity history on file. The patient  has no past surgical history on file.  Her family history includes Cancer in her father and sister.  Review of Systems  Constitutional: Positive for chills. Negative for fever.  HENT: Positive for sore throat. Negative for hearing loss and sinus pain.   Respiratory: Negative for cough, hemoptysis, shortness of breath and wheezing (no history of ashtma).   Cardiovascular: Negative for chest pain.  Gastrointestinal: Positive for abdominal pain (Tuesday of last week. ) and nausea (Tuesday of last week). Negative for diarrhea.  Genitourinary: Negative for dysuria, frequency and urgency.  Skin: Negative for rash.  Neurological: Positive for headaches (waxing and waning). Negative for dizziness.    The problem list and medications were reviewed and updated by myself where necessary and exist elsewhere in the encounter.   OBJECTIVE:  BP (!) 108/48   Pulse 79   Temp 98.6 F (37 C) (Oral)   Resp 16   Ht 5\' 2"  (1.575 m)   Wt 112 lb (50.8 kg)   SpO2 98%   BMI 20.49 kg/m   BP Readings from Last 3 Encounters:  06/13/16 (!) 108/48  02/23/16 94/62  05/13/15 120/76     Physical Exam  Constitutional: She is oriented to person, place, and time.  HENT:  Right  Ear: External ear normal.  Left Ear: External ear normal.  Nose: Mucosal edema present. Right sinus exhibits no maxillary sinus tenderness and no frontal sinus tenderness. Left sinus exhibits no maxillary sinus tenderness and no frontal sinus tenderness.  Mouth/Throat: Oropharynx is clear and moist. No oropharyngeal exudate.  Eyes: Conjunctivae are normal. Pupils are equal, round, and reactive to light.  Cardiovascular: Regular rhythm and normal heart sounds.   Pulmonary/Chest: Effort normal and breath sounds normal. She has no rales.  Neurological: She is alert and oriented to person, place, and time.  Skin: Skin is warm and dry. No rash noted. She is not diaphoretic. No erythema.  Psychiatric: Her behavior is normal.    No results found for this or any previous visit (from the past 72 hour(s)).  No results found.  ASSESSMENT AND PLAN:  Infant was seen today for sore throat, generalized body aches and nausea.  Diagnoses and all orders for this visit:  Acute URI: Sed rate elevated.  Symptoms greater than 2 weeks now.  Will go ahead and start a broad spectrum Abx.   -     amoxicillin-clavulanate (AUGMENTIN) 875-125 MG tablet; Take 1 tablet by mouth 2 (two) times daily. Okay to fill in ten days if symptoms not improving, or sooner if fever greater than 100.4 or sed rate positive. -     POCT SEDIMENTATION RATE    The  patient is advised to call or return to clinic if she does not see an improvement in symptoms, or to seek the care of the closest emergency department if she worsens with the above plan.   Deliah BostonMichael Clark, MHS, PA-C [facility] 06/13/2016 4:34 PM

## 2017-04-11 ENCOUNTER — Other Ambulatory Visit: Payer: Self-pay

## 2017-04-11 ENCOUNTER — Encounter: Payer: Self-pay | Admitting: Family Medicine

## 2017-04-11 ENCOUNTER — Ambulatory Visit: Payer: BLUE CROSS/BLUE SHIELD | Admitting: Family Medicine

## 2017-04-11 VITALS — BP 116/74 | HR 75 | Temp 98.4°F | Resp 16 | Ht 62.0 in | Wt 113.6 lb

## 2017-04-11 DIAGNOSIS — L259 Unspecified contact dermatitis, unspecified cause: Secondary | ICD-10-CM | POA: Diagnosis not present

## 2017-04-11 MED ORDER — TRIAMCINOLONE ACETONIDE 0.1 % EX CREA: 1.0000 "application " | TOPICAL_CREAM | Freq: Two times a day (BID) | CUTANEOUS | 0 refills | Status: DC

## 2017-04-11 NOTE — Patient Instructions (Addendum)
Rash on neck appears to be contact dermatitis, and possibly from massage oil.  Start the steroid cream 2-3 times per day to affected areas. If unable to use in scalp, call and I can send in a steroid foam.  Try zyrtec once per day, and benadryl at night if needed.  If rash continues to spread, or any worsening symptoms - please return for recheck.    Contact Dermatitis Dermatitis is redness, soreness, and swelling (inflammation) of the skin. Contact dermatitis is a reaction to certain substances that touch the skin. There are two types of contact dermatitis:  Irritant contact dermatitis. This type is caused by something that irritates your skin, such as dry hands from washing them too much. This type does not require previous exposure to the substance for a reaction to occur. This type is more common.  Allergic contact dermatitis. This type is caused by a substance that you are allergic to, such as a nickel allergy or poison ivy. This type only occurs if you have been exposed to the substance (allergen) before. Upon a repeat exposure, your body reacts to the substance. This type is less common.  What are the causes? Many different substances can cause contact dermatitis. Irritant contact dermatitis is most commonly caused by exposure to:  Makeup.  Soaps.  Detergents.  Bleaches.  Acids.  Metal salts, such as nickel.  Allergic contact dermatitis is most commonly caused by exposure to:  Poisonous plants.  Chemicals.  Jewelry.  Latex.  Medicines.  Preservatives in products, such as clothing.  What increases the risk? This condition is more likely to develop in:  People who have jobs that expose them to irritants or allergens.  People who have certain medical conditions, such as asthma or eczema.  What are the signs or symptoms? Symptoms of this condition Phariss occur anywhere on your body where the irritant has touched you or is touched by you. Symptoms  include:  Dryness or flaking.  Redness.  Cracks.  Itching.  Pain or a burning feeling.  Blisters.  Drainage of small amounts of blood or clear fluid from skin cracks.  With allergic contact dermatitis, there Bolender also be swelling in areas such as the eyelids, mouth, or genitals. How is this diagnosed? This condition is diagnosed with a medical history and physical exam. A patch skin test Cardy be performed to help determine the cause. If the condition is related to your job, you Ramstad need to see an occupational medicine specialist. How is this treated? Treatment for this condition includes figuring out what caused the reaction and protecting your skin from further contact. Treatment Darcey also include:  Steroid creams or ointments. Oral steroid medicines Volante be needed in more severe cases.  Antibiotics or antibacterial ointments, if a skin infection is present.  Antihistamine lotion or an antihistamine taken by mouth to ease itching.  A bandage (dressing).  Follow these instructions at home: Skin Care  Moisturize your skin as needed.  Apply cool compresses to the affected areas.  Try taking a bath with: ? Epsom salts. Follow the instructions on the packaging. You can get these at your local pharmacy or grocery store. ? Baking soda. Pour a small amount into the bath as directed by your health care provider. ? Colloidal oatmeal. Follow the instructions on the packaging. You can get this at your local pharmacy or grocery store.  Try applying baking soda paste to your skin. Stir water into baking soda until it reaches a paste-like consistency.  Do not scratch your skin.  Bathe less frequently, such as every other day.  Bathe in lukewarm water. Avoid using hot water. Medicines  Take or apply over-the-counter and prescription medicines only as told by your health care provider.  If you were prescribed an antibiotic medicine, take or apply your antibiotic as told by your  health care provider. Do not stop using the antibiotic even if your condition starts to improve. General instructions  Keep all follow-up visits as told by your health care provider. This is important.  Avoid the substance that caused your reaction. If you do not know what caused it, keep a journal to try to track what caused it. Write down: ? What you eat. ? What cosmetic products you use. ? What you drink. ? What you wear in the affected area. This includes jewelry.  If you were given a dressing, take care of it as told by your health care provider. This includes when to change and remove it. Contact a health care provider if:  Your condition does not improve with treatment.  Your condition gets worse.  You have signs of infection such as swelling, tenderness, redness, soreness, or warmth in the affected area.  You have a fever.  You have new symptoms. Get help right away if:  You have a severe headache, neck pain, or neck stiffness.  You vomit.  You feel very sleepy.  You notice red streaks coming from the affected area.  Your bone or joint underneath the affected area becomes painful after the skin has healed.  The affected area turns darker.  You have difficulty breathing. This information is not intended to replace advice given to you by your health care provider. Make sure you discuss any questions you have with your health care provider. Document Released: 05/13/2000 Document Revised: 10/22/2015 Document Reviewed: 10/01/2014 Elsevier Interactive Patient Education  2018 [employer]   IF you received an x-ray today, you will receive an invoice from [facility]. Please contact [facility] at [phone] with questions or concerns regarding your invoice.   IF you received labwork today, you will receive an invoice from [facility]. Please contact [facility] at [phone] with questions or concerns regarding your invoice.   Our billing staff will  not be able to assist you with questions regarding bills from these companies.  You will be contacted with the lab results as soon as they are available. The fastest way to get your results is to activate your My Chart account. Instructions are located on the last page of this paperwork. If you have not heard from us regarding the results in 2 weeks, please contact this office.

## 2017-04-11 NOTE — Progress Notes (Signed)
Subjective:  By signing my name below, I, Essence Howell, attest that this documentation has been prepared under the direction and in the presence of Shade Flood, MD Electronically Signed: Charline Bills, ED Scribe 04/11/2017 at 4:43 PM.   Patient ID: Monique Johnston, female    DOB: 06/24/1960, 56 y.o.   MRN: [id number]  Chief Complaint  Patient presents with  . Rash    patient presents with rash to nape of neck and both shoulders x 2 weeks. Patient denies exposure to plants or animals or change in soaps or lotions, states that she got a massage appx 2 weeks ago, unsure of oil or product use   HPI Monique Johnston is a 56 y.o. female who presents to Primary Care at [facility] complaining of a pruritic rash to her neck, throughout the scalp and upper shoulders x 2 weeks. Pt states that she had a stiff neck and shoulders ~2 weeks ago and suspects that rash was toxins being released from her body, however, the rash did not resolve. She states that the masseuse did use oils that she is unsure of ingredients, but she denies rash in any other areas. Pt also reports alternating between a yellow gold and silver necklace that she wears often. She has tried tea tree oil, aloe and cortisone cream for a couple days without relief. Denies fever, cold-like symptoms, rash in mouth or genitals, HA, changes to soaps, detergents, fabric softeners, lotions, make-up, new jewelry, exposure to chemicals, pets or plants, contacts with similar rash. Pt does report that she is in remission from alcohol; 21 years sober but attending AA meetings.   There are no active problems to display for this patient.  Past Medical History:  Diagnosis Date  . Alcoholism in remission (HCC)   . Fibromyalgia    No past surgical history on file. No Known Allergies Prior to Admission medications   Medication Sig Start Date End Date Taking? Authorizing Provider  amoxicillin-clavulanate (AUGMENTIN) 875-125 MG tablet Take 1 tablet by mouth 2  (two) times daily. Okay to fill in ten days if symptoms not improving, or sooner if fever greater than 100.4 or sed rate positive. Patient not taking: Reported on 04/11/2017 06/13/16   Ofilia Neas, PA-C   Social History   Socioeconomic History  . Marital status: Married    Spouse name: Not on file  . Number of children: Not on file  . Years of education: Not on file  . Highest education level: Not on file  Social Needs  . Financial resource strain: Not on file  . Food insecurity - worry: Not on file  . Food insecurity - inability: Not on file  . Transportation needs - medical: Not on file  . Transportation needs - non-medical: Not on file  Occupational History  . Not on file  Tobacco Use  . Smoking status: Never Smoker  . Smokeless tobacco: Never Used  Substance and Sexual Activity  . Alcohol use: No    Alcohol/week: 0.0 oz  . Drug use: No  . Sexual activity: Not on file  Other Topics Concern  . Not on file  Social History Narrative  . Not on file   Review of Systems  Constitutional: Negative for fever.  HENT: Negative for mouth sores.   Genitourinary: Negative for genital sores.  Skin: Positive for rash.  Neurological: Negative for headaches.      Objective:   Physical Exam  Constitutional: She is oriented to person, place, and time.  She appears well-developed and well-nourished. No distress.  HENT:  Head: Normocephalic and atraumatic.  Mouth/Throat: No oral lesions.  Eyes: Conjunctivae and EOM are normal.  Neck: Neck supple. No tracheal deviation present.  Cardiovascular: Normal rate.  Pulmonary/Chest: Effort normal. No respiratory distress.  Musculoskeletal: Normal range of motion.  Neurological: She is alert and oriented to person, place, and time.  Skin: Skin is warm and dry. Rash noted.  Excoriated papular rash across upper back and posterior neck. Few small scattered erythematous papules into the base of scalp posteriorly. Rash extends to the lateral  neck. Few erythematous patches onto the anterior neck and upper chest. No lower leg symptoms. No rash on hands or arms.  Psychiatric: She has a normal mood and affect. Her behavior is normal.  Nursing note and vitals reviewed.  Vitals:   04/11/17 1611  BP: 116/74  Pulse: 75  Resp: 16  Temp: 98.4 F (36.9 C)  TempSrc: Oral  SpO2: 99%  Weight: 113 lb 9.6 oz (51.5 kg)  Height: 5\' 2"  (1.575 m)      Assessment & Plan:    Monique Johnston is a 56 y.o. female Contact dermatitis, unspecified contact dermatitis type, unspecified trigger - Plan: triamcinolone cream (KENALOG) 0.1 %  - Denies systemic symptoms, or recent illness. Based on location and primary symptom of pruritus, suspected contact dermatitis. Marvin have been related to the massage oil used based on timing of symptoms   -Start triamcinolone cream to affected areas including scalp if needed 2-3 times per day. If she is having difficulty applying this to scalp, can also send in a foam or shampoo   -Start Zyrtec over-the-counter once per day, Benadryl at night if needed.   -RTC precautions if not improving or worsening   Meds ordered this encounter  Medications  . triamcinolone cream (KENALOG) 0.1 %    Sig: Apply 1 application 2 (two) times daily topically.    Dispense:  30 g    Refill:  0   Patient Instructions   Rash on neck appears to be contact dermatitis, and possibly from massage oil.  Start the steroid cream 2-3 times per day to affected areas. If unable to use in scalp, call and I can send in a steroid foam.  Try zyrtec once per day, and benadryl at night if needed.  If rash continues to spread, or any worsening symptoms - please return for recheck.    Contact Dermatitis Dermatitis is redness, soreness, and swelling (inflammation) of the skin. Contact dermatitis is a reaction to certain substances that touch the skin. There are two types of contact dermatitis:  Irritant contact dermatitis. This type is caused by  something that irritates your skin, such as dry hands from washing them too much. This type does not require previous exposure to the substance for a reaction to occur. This type is more common.  Allergic contact dermatitis. This type is caused by a substance that you are allergic to, such as a nickel allergy or poison ivy. This type only occurs if you have been exposed to the substance (allergen) before. Upon a repeat exposure, your body reacts to the substance. This type is less common.  What are the causes? Many different substances can cause contact dermatitis. Irritant contact dermatitis is most commonly caused by exposure to:  Makeup.  Soaps.  Detergents.  Bleaches.  Acids.  Metal salts, such as nickel.  Allergic contact dermatitis is most commonly caused by exposure to:  Poisonous plants.  Chemicals.  Jewelry.  Latex.  Medicines.  Preservatives in products, such as clothing.  What increases the risk? This condition is more likely to develop in:  People who have jobs that expose them to irritants or allergens.  People who have certain medical conditions, such as asthma or eczema.  What are the signs or symptoms? Symptoms of this condition Dial occur anywhere on your body where the irritant has touched you or is touched by you. Symptoms include:  Dryness or flaking.  Redness.  Cracks.  Itching.  Pain or a burning feeling.  Blisters.  Drainage of small amounts of blood or clear fluid from skin cracks.  With allergic contact dermatitis, there Anastos also be swelling in areas such as the eyelids, mouth, or genitals. How is this diagnosed? This condition is diagnosed with a medical history and physical exam. A patch skin test Jasmin be performed to help determine the cause. If the condition is related to your job, you Footman need to see an occupational medicine specialist. How is this treated? Treatment for this condition includes figuring out what caused the  reaction and protecting your skin from further contact. Treatment Barona also include:  Steroid creams or ointments. Oral steroid medicines Pompa be needed in more severe cases.  Antibiotics or antibacterial ointments, if a skin infection is present.  Antihistamine lotion or an antihistamine taken by mouth to ease itching.  A bandage (dressing).  Follow these instructions at home: Skin Care  Moisturize your skin as needed.  Apply cool compresses to the affected areas.  Try taking a bath with: ? Epsom salts. Follow the instructions on the packaging. You can get these at your local pharmacy or grocery store. ? Baking soda. Pour a small amount into the bath as directed by your health care provider. ? Colloidal oatmeal. Follow the instructions on the packaging. You can get this at your local pharmacy or grocery store.  Try applying baking soda paste to your skin. Stir water into baking soda until it reaches a paste-like consistency.  Do not scratch your skin.  Bathe less frequently, such as every other day.  Bathe in lukewarm water. Avoid using hot water. Medicines  Take or apply over-the-counter and prescription medicines only as told by your health care provider.  If you were prescribed an antibiotic medicine, take or apply your antibiotic as told by your health care provider. Do not stop using the antibiotic even if your condition starts to improve. General instructions  Keep all follow-up visits as told by your health care provider. This is important.  Avoid the substance that caused your reaction. If you do not know what caused it, keep a journal to try to track what caused it. Write down: ? What you eat. ? What cosmetic products you use. ? What you drink. ? What you wear in the affected area. This includes jewelry.  If you were given a dressing, take care of it as told by your health care provider. This includes when to change and remove it. Contact a health care provider  if:  Your condition does not improve with treatment.  Your condition gets worse.  You have signs of infection such as swelling, tenderness, redness, soreness, or warmth in the affected area.  You have a fever.  You have new symptoms. Get help right away if:  You have a severe headache, neck pain, or neck stiffness.  You vomit.  You feel very sleepy.  You notice red streaks coming from the affected area.  Your bone or joint underneath the affected area becomes painful after the skin has healed.  The affected area turns darker.  You have difficulty breathing. This information is not intended to replace advice given to you by your health care provider. Make sure you discuss any questions you have with your health care provider. Document Released: 05/13/2000 Document Revised: 10/22/2015 Document Reviewed: 10/01/2014 Elsevier Interactive Patient Education  2018 [employer]   IF you received an x-ray today, you will receive an invoice from [facility]. Please contact [facility] at [phone] with questions or concerns regarding your invoice.   IF you received labwork today, you will receive an invoice from [facility]. Please contact [facility] at [phone] with questions or concerns regarding your invoice.   Our billing staff will not be able to assist you with questions regarding bills from these companies.  You will be contacted with the lab results as soon as they are available. The fastest way to get your results is to activate your My Chart account. Instructions are located on the last page of this paperwork. If you have not heard from us regarding the results in 2 weeks, please contact this office.      I personally performed the services described in this documentation, which was scribed in my presence. The recorded information has been reviewed and considered for accuracy and completeness, addended by me as needed, and agree with information  above.  Signed,   Meredith StaggersJeffrey Rosena Bartle, MD Primary Care at [facility].  04/12/17 10:10 PM

## 2017-04-12 ENCOUNTER — Encounter: Payer: Self-pay | Admitting: Family Medicine

## 2017-10-18 ENCOUNTER — Encounter: Payer: Self-pay | Admitting: Physician Assistant

## 2017-10-18 ENCOUNTER — Other Ambulatory Visit: Payer: Self-pay

## 2017-10-18 ENCOUNTER — Ambulatory Visit (INDEPENDENT_AMBULATORY_CARE_PROVIDER_SITE_OTHER): Payer: BLUE CROSS/BLUE SHIELD

## 2017-10-18 ENCOUNTER — Ambulatory Visit: Payer: BLUE CROSS/BLUE SHIELD | Admitting: Physician Assistant

## 2017-10-18 VITALS — BP 102/64 | HR 76 | Temp 99.2°F | Resp 16 | Ht 61.1 in | Wt 113.6 lb

## 2017-10-18 DIAGNOSIS — M79604 Pain in right leg: Secondary | ICD-10-CM | POA: Diagnosis not present

## 2017-10-18 DIAGNOSIS — M545 Low back pain: Secondary | ICD-10-CM | POA: Diagnosis not present

## 2017-10-18 DIAGNOSIS — M79605 Pain in left leg: Secondary | ICD-10-CM

## 2017-10-18 DIAGNOSIS — M25561 Pain in right knee: Secondary | ICD-10-CM

## 2017-10-18 DIAGNOSIS — M25562 Pain in left knee: Secondary | ICD-10-CM

## 2017-10-18 DIAGNOSIS — R5383 Other fatigue: Secondary | ICD-10-CM

## 2017-10-18 NOTE — Progress Notes (Signed)
Patient ID: Monique Johnston, female    DOB: 03/17/1961, 57 y.o.   MRN: [id number]  PCP: Patient, No Pcp Per Considers this practice her primary care office. No provider in particular.  Chief Complaint  Patient presents with  . Knee Pain    both started hurting 2 months ago legs feel heavy     Subjective:   Presents for evaluation of knee and leg pain. Symptoms began about 2 months ago.  No specific trauma or injury identified. No new activities.  Legs feel heavy. Feels really tired all the time.  Even her mental energy is lower than before.  Feels worn down.  Feels like her gait is shuffled. No trips, falls, stumbling. Feels like she moves more slowly because she is tired. Handwriting is sloppier, but not smaller.  "I sit around a lot and eat." Has gained some weight. Feels heavier. Her stomach has gotten fatter. Lives with her mother, more sedentary now, probably snacks more.  No previous knee pain. No life changes, changes in activities, eating or sleeping. No back pain. No abdominal pain.  No heat or cold intolerance. No skin/hair/nail changes. No diarrhea/constipation. No urinary symptoms.   Aleve without much benefit.   Last labs 01/2016. Vitamin D 39 TSH normal TC 2017, TG 47, HDL 93, LDL 105 Hep C negative HIV negative CBC normal CMET normal   Review of Systems  Constitutional: Positive for fatigue and unexpected weight change. Negative for activity change, appetite change, chills, diaphoresis and fever.  HENT: Negative for sore throat.   Eyes: Negative for visual disturbance.  Respiratory: Negative for cough, chest tightness, shortness of breath and wheezing.   Cardiovascular: Negative for chest pain and palpitations.  Gastrointestinal: Negative for abdominal pain, diarrhea, nausea and vomiting.  Endocrine: Negative for cold intolerance, heat intolerance, polydipsia, polyphagia and polyuria.  Genitourinary: Negative for dysuria, frequency,  hematuria and urgency.  Musculoskeletal: Positive for arthralgias (knees) and myalgias (legs feel heavy).  Skin: Negative for rash.  Neurological: Negative for dizziness, weakness and headaches.  Psychiatric/Behavioral: Negative for decreased concentration. The patient is not nervous/anxious.        There are no active problems to display for this patient.    Prior to Admission medications   Medication Sig Start Date End Date Taking? Authorizing Provider  triamcinolone cream (KENALOG) 0.1 % Apply 1 application 2 (two) times daily topically. Patient not taking: Reported on 10/18/2017 04/11/17   Shade Flood, MD     No Known Allergies     Objective:  Physical Exam  Constitutional: She is oriented to person, place, and time. She appears well-developed and well-nourished. She is active and cooperative. No distress.  BP 102/64   Pulse 76   Temp 99.2 F (37.3 C)   Resp 16   Ht 5' 1.1" (1.552 m)   Wt 113 lb 9.6 oz (51.5 kg)   SpO2 99%   BMI 21.39 kg/m   HENT:  Head: Normocephalic and atraumatic.  Right Ear: Hearing normal.  Left Ear: Hearing normal.  Eyes: Conjunctivae are normal. No scleral icterus.  Neck: Normal range of motion. Neck supple. No thyromegaly present.  Cardiovascular: Normal rate, regular rhythm and normal heart sounds.  Pulses:      Radial pulses are 2+ on the right side, and 2+ on the left side.  Pulmonary/Chest: Effort normal and breath sounds normal.  Musculoskeletal:       Right hip: Normal.       Left hip: Normal.  Right knee: Normal.       Left knee: Normal.       Right ankle: Normal.       Left ankle: Normal.       Cervical back: Normal.       Thoracic back: Normal.       Lumbar back: Normal.       Right upper leg: Normal.       Left upper leg: Normal.       Right lower leg: Normal.       Left lower leg: Normal.  Lymphadenopathy:       Head (right side): No tonsillar, no preauricular, no posterior auricular and no occipital  adenopathy present.       Head (left side): No tonsillar, no preauricular, no posterior auricular and no occipital adenopathy present.    She has no cervical adenopathy.       Right: No supraclavicular adenopathy present.       Left: No supraclavicular adenopathy present.  Neurological: She is alert and oriented to person, place, and time. No sensory deficit.  Skin: Skin is warm, dry and intact. No rash noted. No cyanosis or erythema. Nails show no clubbing.  Psychiatric: She has a normal mood and affect. Her speech is normal and behavior is normal.     Wt Readings from Last 3 Encounters:  10/18/17 113 lb 9.6 oz (51.5 kg)  04/11/17 113 lb 9.6 oz (51.5 kg)  06/13/16 112 lb (50.8 kg)       Assessment & Plan:   1. Acute pain of both knees Unclear etiology.  Radiographs. - DG Knee Complete 4 Views Right; Future - DG Knee Complete 4 Views Left; Future  2. Leg pain, bilateral Unclear etiology.  Await radiographs. - DG Lumbar Spine Complete; Future  3. Fatigue, unspecified type Unclear etiology.  Await lab results. - CBC with Differential/Platelet - Comprehensive metabolic panel - TSH    No follow-ups on file. Plan follow-up pending lab results.   Fernande Bras, PA-C Primary Care at [facility]

## 2017-10-18 NOTE — Patient Instructions (Signed)
     IF you received an x-ray today, you will receive an invoice from [facility]. Please contact [facility] at [phone] with questions or concerns regarding your invoice.   IF you received labwork today, you will receive an invoice from [facility]. Please contact [facility] at [phone] with questions or concerns regarding your invoice.   Our billing staff will not be able to assist you with questions regarding bills from these companies.  You will be contacted with the lab results as soon as they are available. The fastest way to get your results is to activate your My Chart account. Instructions are located on the last page of this paperwork. If you have not heard from us regarding the results in 2 weeks, please contact this office.     

## 2017-10-19 LAB — CBC WITH DIFFERENTIAL/PLATELET
BASOS: 0 %
Basophils Absolute: 0 10*3/uL (ref 0.0–0.2)
EOS (ABSOLUTE): 0.1 10*3/uL (ref 0.0–0.4)
Eos: 2 %
Hematocrit: 33.5 % — ABNORMAL LOW (ref 34.0–46.6)
Hemoglobin: 11.4 g/dL (ref 11.1–15.9)
Immature Grans (Abs): 0 10*3/uL (ref 0.0–0.1)
Immature Granulocytes: 0 %
LYMPHS ABS: 2.1 10*3/uL (ref 0.7–3.1)
Lymphs: 36 %
MCH: 29.5 pg (ref 26.6–33.0)
MCHC: 34 g/dL (ref 31.5–35.7)
MCV: 87 fL (ref 79–97)
MONOS ABS: 0.7 10*3/uL (ref 0.1–0.9)
Monocytes: 12 %
Neutrophils Absolute: 2.8 10*3/uL (ref 1.4–7.0)
Neutrophils: 50 %
PLATELETS: 298 10*3/uL (ref 150–450)
RBC: 3.87 x10E6/uL (ref 3.77–5.28)
RDW: 14.9 % (ref 12.3–15.4)
WBC: 5.8 10*3/uL (ref 3.4–10.8)

## 2017-10-19 LAB — COMPREHENSIVE METABOLIC PANEL
A/G RATIO: 2.2 (ref 1.2–2.2)
ALT: 15 IU/L (ref 0–32)
AST: 19 IU/L (ref 0–40)
Albumin: 4.4 g/dL (ref 3.5–5.5)
Alkaline Phosphatase: 68 IU/L (ref 39–117)
BUN/Creatinine Ratio: 12 (ref 9–23)
BUN: 9 mg/dL (ref 6–24)
Bilirubin Total: <0.2 mg/dL (ref 0.0–1.2)
CHLORIDE: 102 mmol/L (ref 96–106)
CO2: 22 mmol/L (ref 20–29)
Calcium: 8.9 mg/dL (ref 8.7–10.2)
Creatinine, Ser: 0.74 mg/dL (ref 0.57–1.00)
GFR calc non Af Amer: 90 mL/min/{1.73_m2} (ref >59)
GFR, EST AFRICAN AMERICAN: 104 mL/min/{1.73_m2} (ref >59)
GLOBULIN, TOTAL: 2 g/dL (ref 1.5–4.5)
Glucose: 82 mg/dL (ref 65–99)
POTASSIUM: 4.4 mmol/L (ref 3.5–5.2)
Sodium: 139 mmol/L (ref 134–144)
TOTAL PROTEIN: 6.4 g/dL (ref 6.0–8.5)

## 2017-10-19 LAB — TSH: TSH: 0.764 u[IU]/mL (ref 0.450–4.500)

## 2017-10-25 ENCOUNTER — Telehealth: Payer: Self-pay

## 2017-10-25 NOTE — Telephone Encounter (Signed)
Copied from CRM [id number]. Topic: General - Other >> Manukyan 28, 2019  3:09 PM Marylen Ponto wrote: Reason for CRM: Pt called in for status of lab results. Pt requesting a call back. Cb# [phone]  Message sent to [facility] for review and release lab results

## 2017-10-25 NOTE — Telephone Encounter (Signed)
The lab results are NORMAL.  The radiographs show moderate arthritis in the low back, none in the knees.  I recommend that she see a physical therapist to work on some exercises to help reduce her back and leg symptoms, that would in turn allow for increased physical activity.  If she is agreeable, I will refer her.

## 2017-10-26 NOTE — Telephone Encounter (Signed)
Left detailed VM advising pt. Crm done.

## 2017-10-26 NOTE — Telephone Encounter (Signed)
Please call patient back for lab results and x-ray.

## 2019-02-15 ENCOUNTER — Ambulatory Visit (INDEPENDENT_AMBULATORY_CARE_PROVIDER_SITE_OTHER): Payer: BC Managed Care – PPO | Admitting: Family

## 2019-02-15 ENCOUNTER — Other Ambulatory Visit: Payer: Self-pay

## 2019-02-15 ENCOUNTER — Other Ambulatory Visit: Payer: BC Managed Care – PPO

## 2019-02-15 ENCOUNTER — Other Ambulatory Visit (INDEPENDENT_AMBULATORY_CARE_PROVIDER_SITE_OTHER): Payer: BC Managed Care – PPO

## 2019-02-15 ENCOUNTER — Other Ambulatory Visit (HOSPITAL_COMMUNITY)
Admission: RE | Admit: 2019-02-15 | Discharge: 2019-02-15 | Disposition: A | Discharge status: Home / Self Care | Payer: BC Managed Care – PPO | Source: Ambulatory Visit | Attending: Family | Admitting: Family

## 2019-02-15 ENCOUNTER — Encounter: Payer: Self-pay | Admitting: Family

## 2019-02-15 VITALS — BP 106/76 | HR 79 | Temp 98.4°F | Ht 61.1 in | Wt 114.0 lb

## 2019-02-15 DIAGNOSIS — Z13220 Encounter for screening for lipoid disorders: Secondary | ICD-10-CM | POA: Diagnosis not present

## 2019-02-15 DIAGNOSIS — Z0000 Encounter for general adult medical examination without abnormal findings: Secondary | ICD-10-CM

## 2019-02-15 DIAGNOSIS — E559 Vitamin D deficiency, unspecified: Secondary | ICD-10-CM | POA: Diagnosis not present

## 2019-02-15 DIAGNOSIS — Z124 Encounter for screening for malignant neoplasm of cervix: Secondary | ICD-10-CM

## 2019-02-15 LAB — COMPREHENSIVE METABOLIC PANEL
ALT: 18 U/L (ref 0–35)
AST: 22 U/L (ref 0–37)
Albumin: 4.6 g/dL (ref 3.5–5.2)
Alkaline Phosphatase: 77 U/L (ref 39–117)
BUN: 8 mg/dL (ref 6–23)
CO2: 27 mEq/L (ref 19–32)
Calcium: 9.5 mg/dL (ref 8.4–10.5)
Chloride: 97 mEq/L (ref 96–112)
Creatinine, Ser: 0.6 mg/dL (ref 0.40–1.20)
GFR: 102.26 mL/min (ref >60.00)
Glucose, Bld: 83 mg/dL (ref 70–99)
Potassium: 4.3 mEq/L (ref 3.5–5.1)
Sodium: 131 mEq/L — ABNORMAL LOW (ref 135–145)
Total Bilirubin: 0.4 mg/dL (ref 0.2–1.2)
Total Protein: 7.3 g/dL (ref 6.0–8.3)

## 2019-02-15 LAB — CBC WITH DIFFERENTIAL/PLATELET
Basophils Absolute: 0 10*3/uL (ref 0.0–0.1)
Basophils Relative: 0.8 % (ref 0.0–3.0)
Eosinophils Absolute: 0.1 10*3/uL (ref 0.0–0.7)
Eosinophils Relative: 2.2 % (ref 0.0–5.0)
HCT: 37.6 % (ref 36.0–46.0)
Hemoglobin: 12.6 g/dL (ref 12.0–15.0)
Lymphocytes Relative: 32.2 % (ref 12.0–46.0)
Lymphs Abs: 1.6 10*3/uL (ref 0.7–4.0)
MCHC: 33.6 g/dL (ref 30.0–36.0)
MCV: 90.2 fl (ref 78.0–100.0)
Monocytes Absolute: 0.8 10*3/uL (ref 0.1–1.0)
Monocytes Relative: 15.3 % — ABNORMAL HIGH (ref 3.0–12.0)
Neutro Abs: 2.5 10*3/uL (ref 1.4–7.7)
Neutrophils Relative %: 49.5 % (ref 43.0–77.0)
Platelets: 304 10*3/uL (ref 150.0–400.0)
RBC: 4.17 Mil/uL (ref 3.87–5.11)
RDW: 14.5 % (ref 11.5–15.5)
WBC: 5 10*3/uL (ref 4.0–10.5)

## 2019-02-15 LAB — LIPID PANEL
Cholesterol: 204 mg/dL — ABNORMAL HIGH (ref 0–200)
HDL: 81.5 mg/dL (ref >39.00)
LDL Cholesterol: 110 mg/dL — ABNORMAL HIGH (ref 0–99)
NonHDL: 122.47
Total CHOL/HDL Ratio: 3
Triglycerides: 60 mg/dL (ref 0.0–149.0)
VLDL: 12 mg/dL (ref 0.0–40.0)

## 2019-02-15 LAB — VITAMIN D 25 HYDROXY (VIT D DEFICIENCY, FRACTURES): VITD: 29.3 ng/mL — ABNORMAL LOW (ref 30.00–100.00)

## 2019-02-15 LAB — TSH: TSH: 1.05 u[IU]/mL (ref 0.35–4.50)

## 2019-02-15 NOTE — Progress Notes (Signed)
Monique Johnston is a 58 y.o. female with the following history as recorded in EpicCare:  There are no active problems to display for this patient.   No current outpatient medications on file.   No current facility-administered medications for this visit.     Allergies: Patient has no known allergies.  Past Medical History:  Diagnosis Date  . Alcoholism in remission ([place])   . Fibromyalgia     History reviewed. No pertinent surgical history.  Family History  Problem Relation Age of Onset  . Cancer Father   . Cancer Sister     Social History   Tobacco Use  . Smoking status: Never Smoker  . Smokeless tobacco: Never Used  Substance Use Topics  . Alcohol use: No    Alcohol/week: 0.0 standard drinks    Subjective:  Patient presents as a new patient today; requesting CPE;  Scheduled for mammogram next week;  Has no interest in colon cancer screening at this time; defers colonoscopy and Cologuard; Up to date on eye exam- scheduled for upcoming appointment; Sees dentist every 6 months;  Walks daily; sleeping fine;  LMP- menopausal;   Review of Systems  Constitutional: Negative.   HENT: Negative.   Eyes: Negative.   Respiratory: Negative.   Cardiovascular: Negative.   Gastrointestinal: Negative.   Genitourinary: Negative.   Musculoskeletal: Negative.   Skin: Negative.   Neurological: Negative.   Psychiatric/Behavioral: Negative.      Objective:  Vitals:   02/15/19 1036  BP: 106/76  Pulse: 79  Temp: 98.4 F (36.9 C)  TempSrc: Oral  SpO2: 99%  Weight: 114 lb (51.7 kg)  Height: 5' 1.1" (1.552 m)    General: Well developed, well nourished, in no acute distress  Skin : Warm and dry.  Head: Normocephalic and atraumatic  Eyes: Sclera and conjunctiva clear; pupils round and reactive to light; extraocular movements intact  Ears: External normal; canals clear; tympanic membranes normal  Oropharynx: Pink, supple. No suspicious lesions  Neck: Supple without thyromegaly,  adenopathy  Lungs: Respirations unlabored; clear to auscultation bilaterally without wheeze, rales, rhonchi  CVS exam: normal rate and regular rhythm.  Abdomen: Soft; nontender; nondistended; normoactive bowel sounds; no masses or hepatosplenomegaly  Musculoskeletal: No deformities; no active joint inflammation  Extremities: No edema, cyanosis, clubbing  Vessels: Symmetric bilaterally  Neurologic: Alert and oriented; speech intact; face symmetrical; moves all extremities well; CNII-XII intact without focal deficit  Pelvic exam: normal external genitalia, vulva, vagina, cervix, uterus and adnexa.  Assessment:  1. PE (physical exam), annual   2. Lipid screening   3. Vitamin D deficiency   4. Cervical cancer screening     Plan:  Age appropriate preventive healthcare needs addressed; encouraged regular eye doctor and dental exams; encouraged regular exercise; will update labs and refills as needed today; follow-up to be determined; Mammogram is scheduled for next week; will plan for DEXA in 2021; patient defers any type of colon cancer screen at this time. Tdap updated as well;  No follow-ups on file.  Orders Placed This Encounter  Procedures  . CBC w/Diff    Standing Status:   Future    Number of Occurrences:   1    Standing Expiration Date:   02/15/2020  . Comp Met (CMET)    Standing Status:   Future    Number of Occurrences:   1    Standing Expiration Date:   02/15/2020  . Lipid panel    Standing Status:   Future  Number of Occurrences:   1    Standing Expiration Date:   02/15/2020  . TSH    Standing Status:   Future    Number of Occurrences:   1    Standing Expiration Date:   02/15/2020  . Vitamin D (25 hydroxy)    Standing Status:   Future    Number of Occurrences:   1    Standing Expiration Date:   02/15/2020    Requested Prescriptions    No prescriptions requested or ordered in this encounter

## 2019-02-19 DIAGNOSIS — Z1239 Encounter for other screening for malignant neoplasm of breast: Secondary | ICD-10-CM | POA: Diagnosis not present

## 2019-02-19 DIAGNOSIS — Z1231 Encounter for screening mammogram for malignant neoplasm of breast: Secondary | ICD-10-CM | POA: Diagnosis not present

## 2019-02-19 LAB — HM MAMMOGRAPHY

## 2019-02-20 ENCOUNTER — Encounter: Payer: Self-pay | Admitting: Family

## 2019-02-20 LAB — CYTOLOGY - PAP
Diagnosis: NEGATIVE
High risk HPV: NEGATIVE
Molecular Disclaimer: 56
Molecular Disclaimer: DETECTED
Molecular Disclaimer: NORMAL

## 2019-02-20 NOTE — Progress Notes (Signed)
Outside notes received. Information abstracted. Notes sent to scan.  

## 2019-10-18 IMAGING — DX DG LUMBAR SPINE COMPLETE 4+V
5 series · 5 of 5 positions shown · non-contrast
Comparison: 09/27/2007

CLINICAL DATA: Low back pain

EXAM:
LUMBAR SPINE - COMPLETE 4+ VIEW

[l-spine ap]
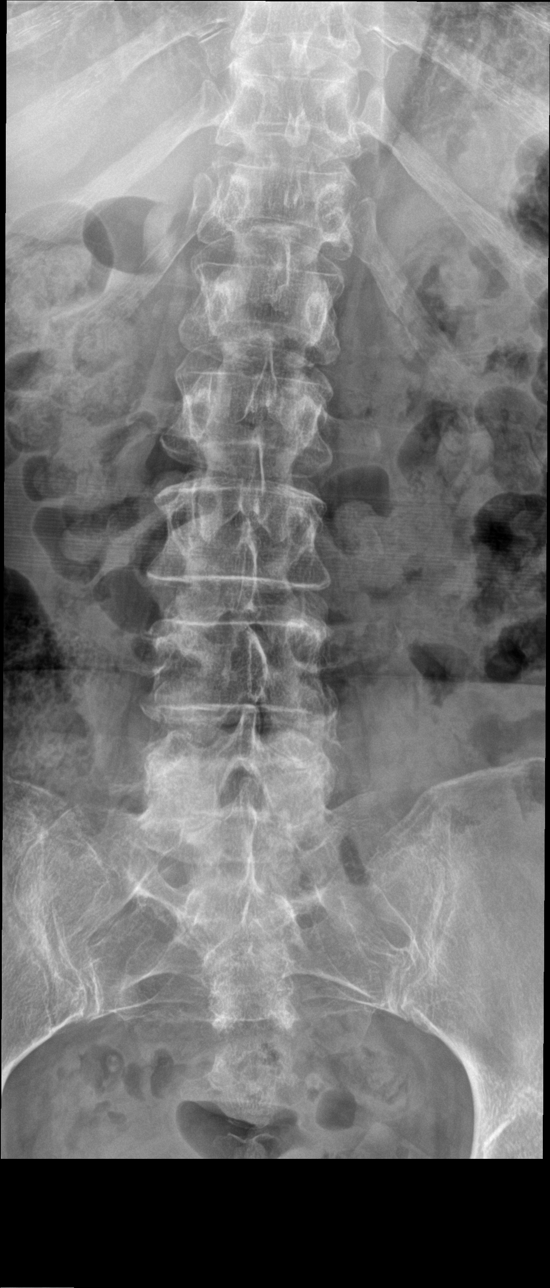

[l-spine obl (1 of 2)]
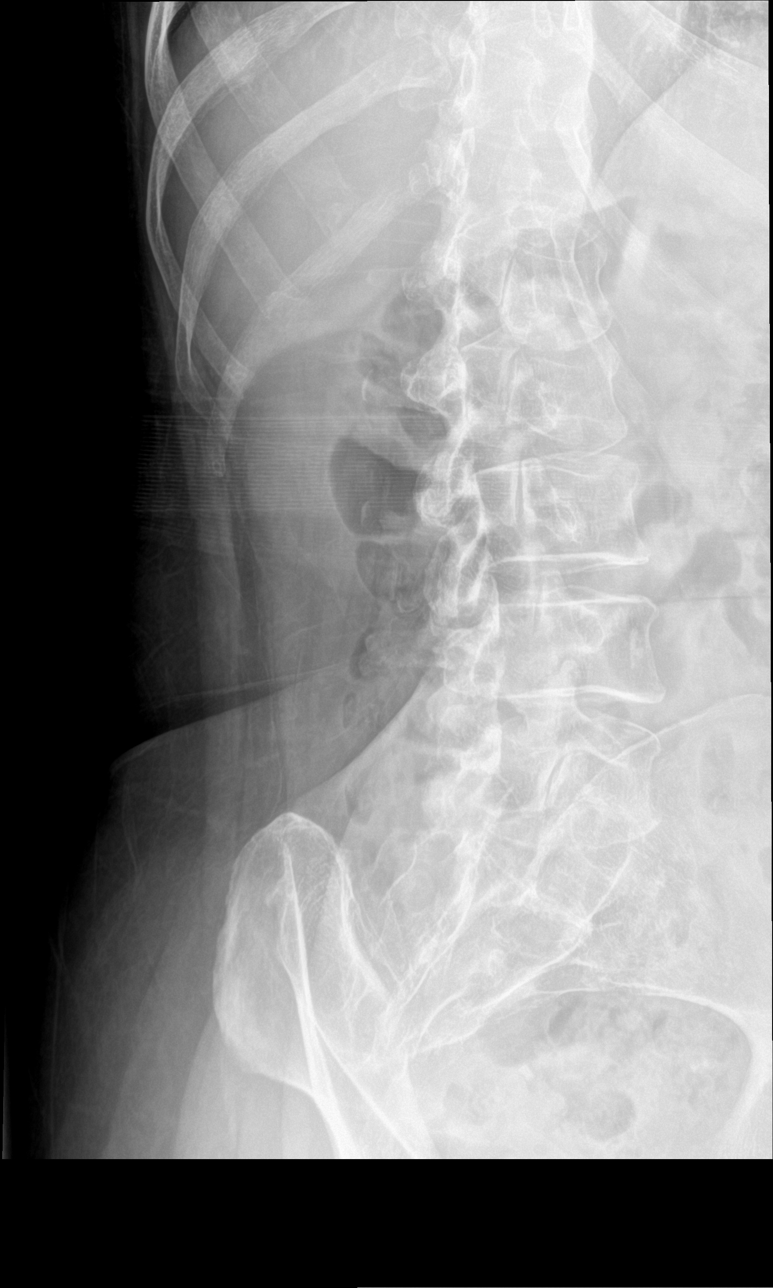

[l-spine obl (2 of 2)]
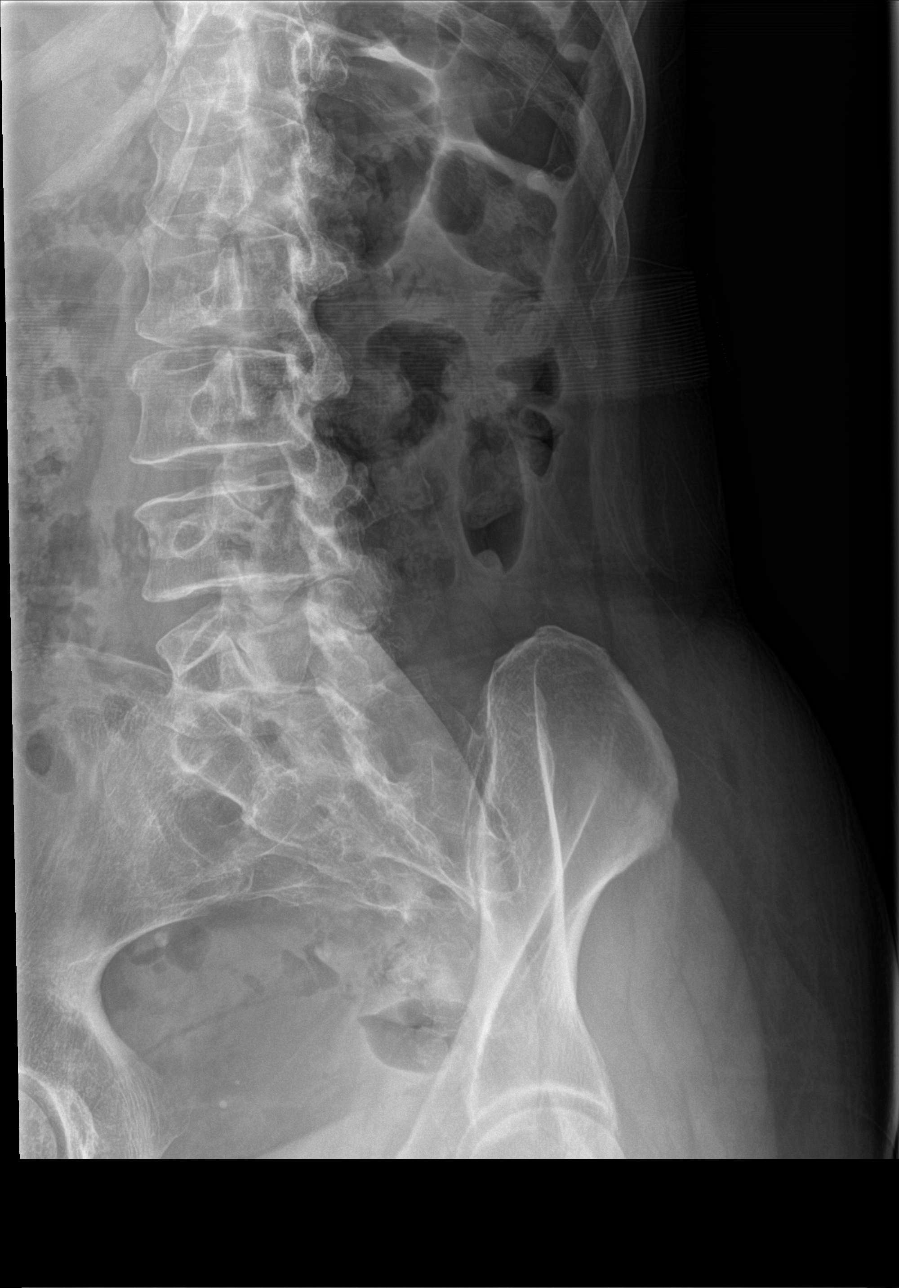

[l-spine lat]
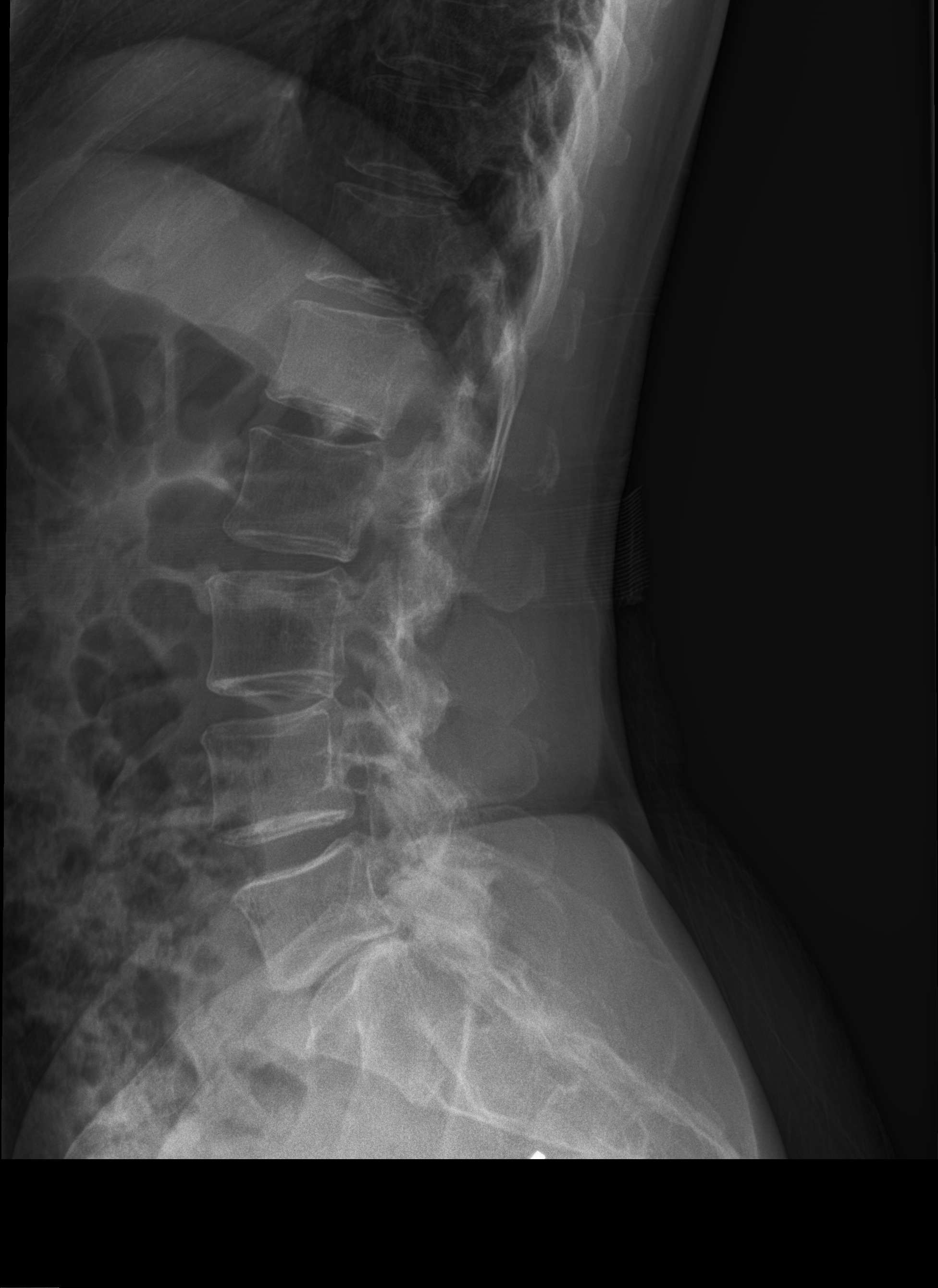

[l-spine l5-s1]
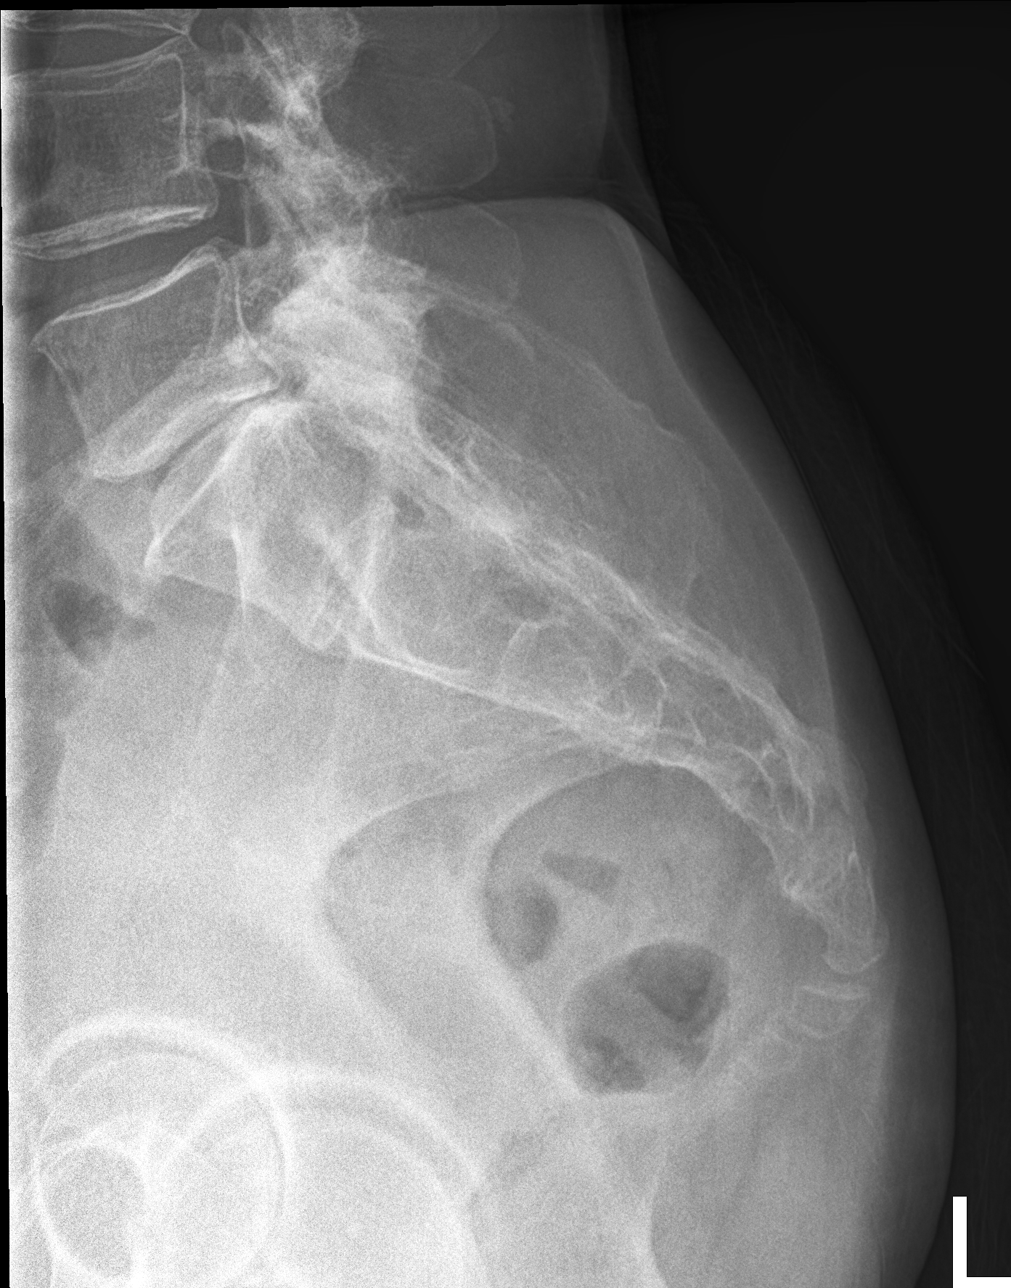

[5 of 5 positions shown; findings below may reference images not displayed]

FINDINGS: Lumbar alignment is within normal limits. Vertebral body heights are
maintained. Moderate degenerative changes at L5-S1.
IMPRESSION: No acute osseous abnormality.  Degenerative changes at L5-S1

## 2019-10-18 IMAGING — DX DG KNEE COMPLETE 4+V*R*
4 series · 4 of 4 positions shown · non-contrast
Comparison: None.

CLINICAL DATA: Bilateral knee pain for 2 months.

EXAM:
RIGHT KNEE - COMPLETE 4+ VIEW

[knee ap]
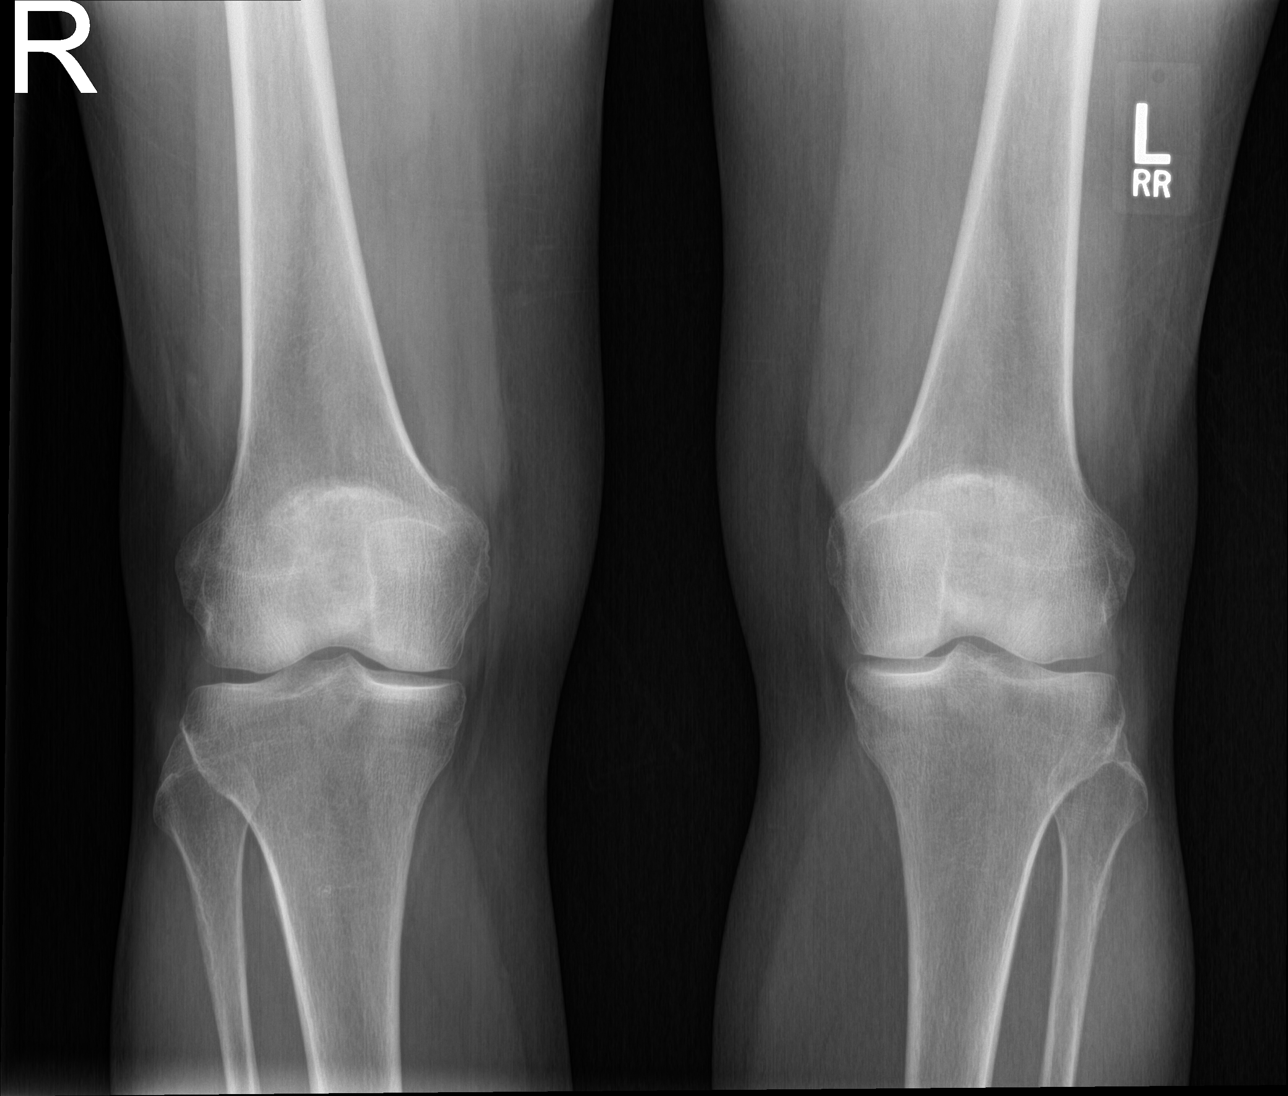

[knee obl (1 of 2)]
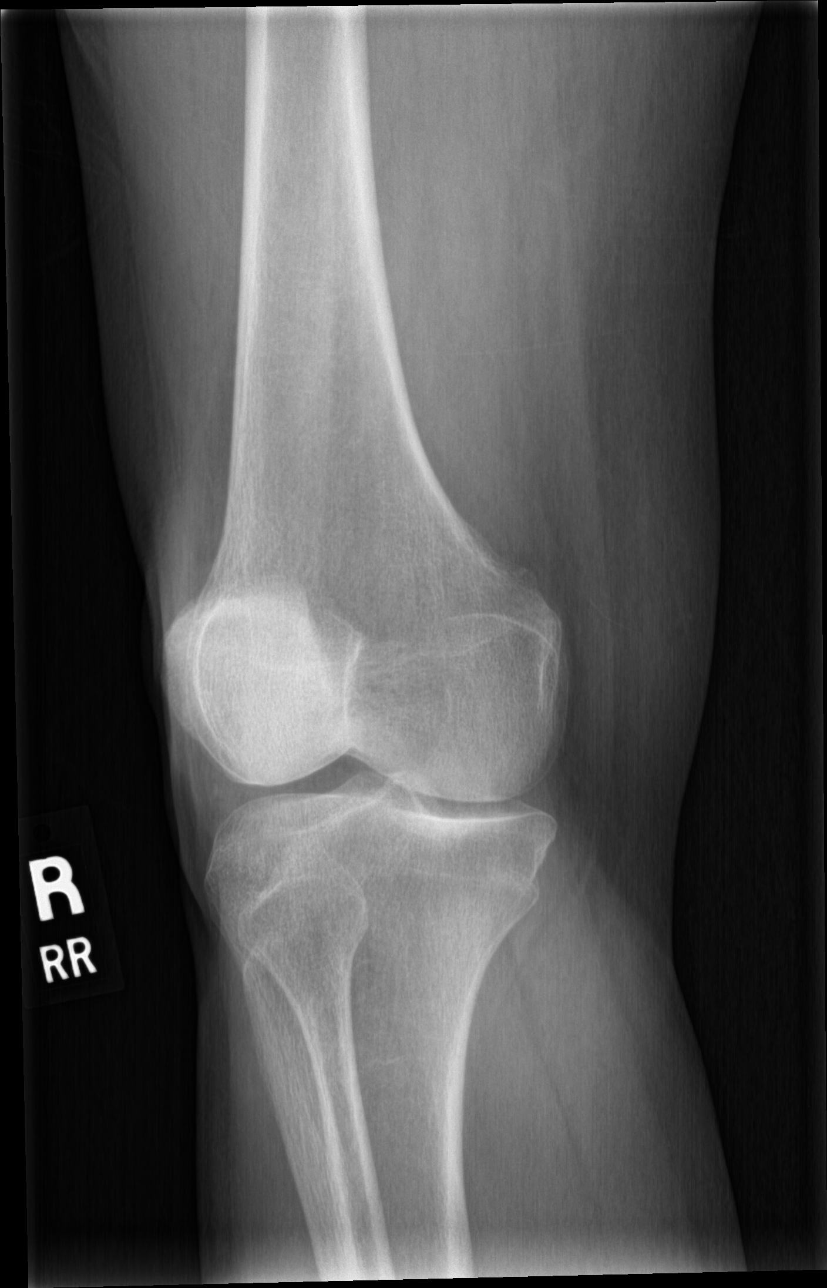

[knee obl (2 of 2)]
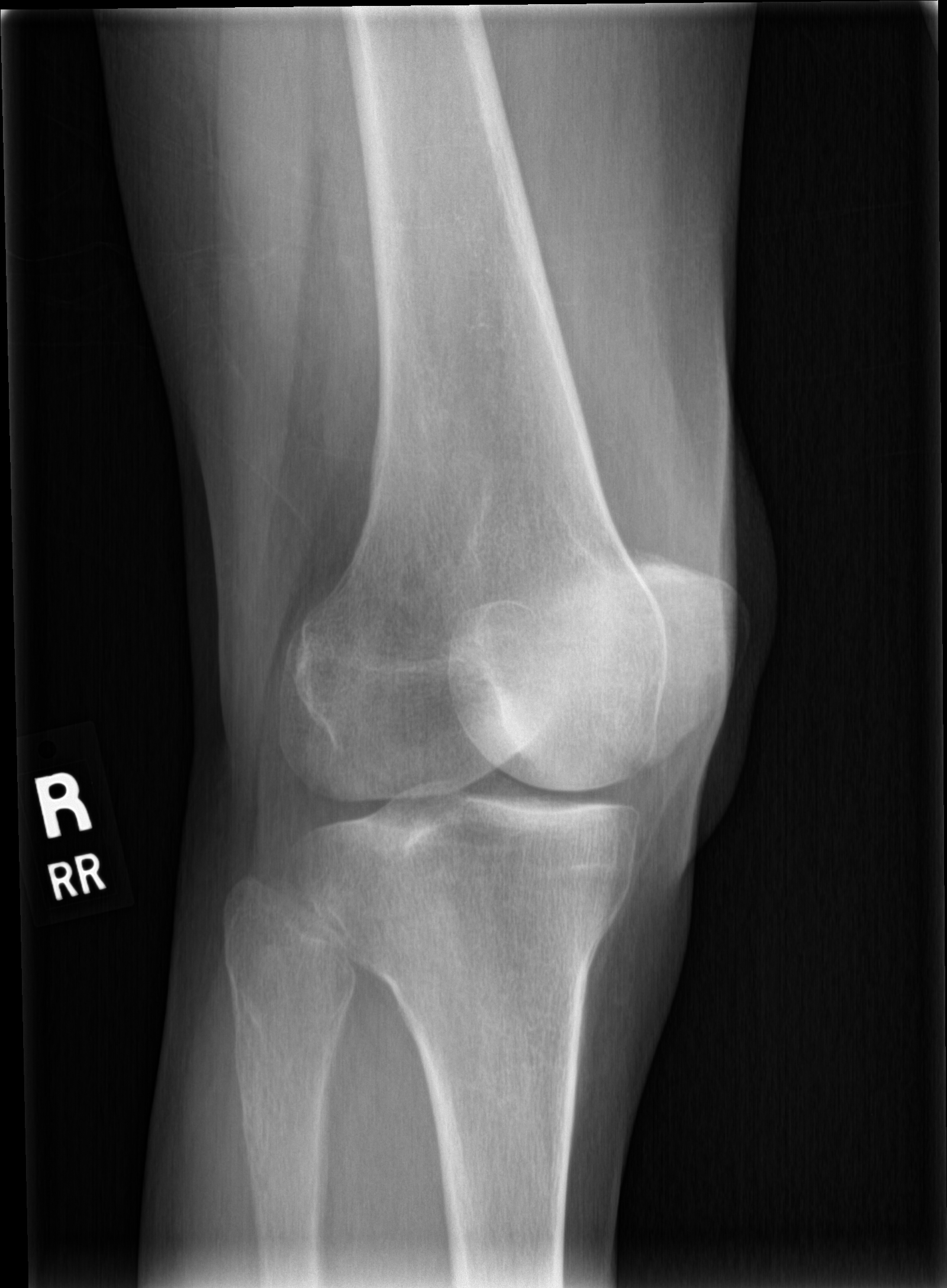

[knee lat]
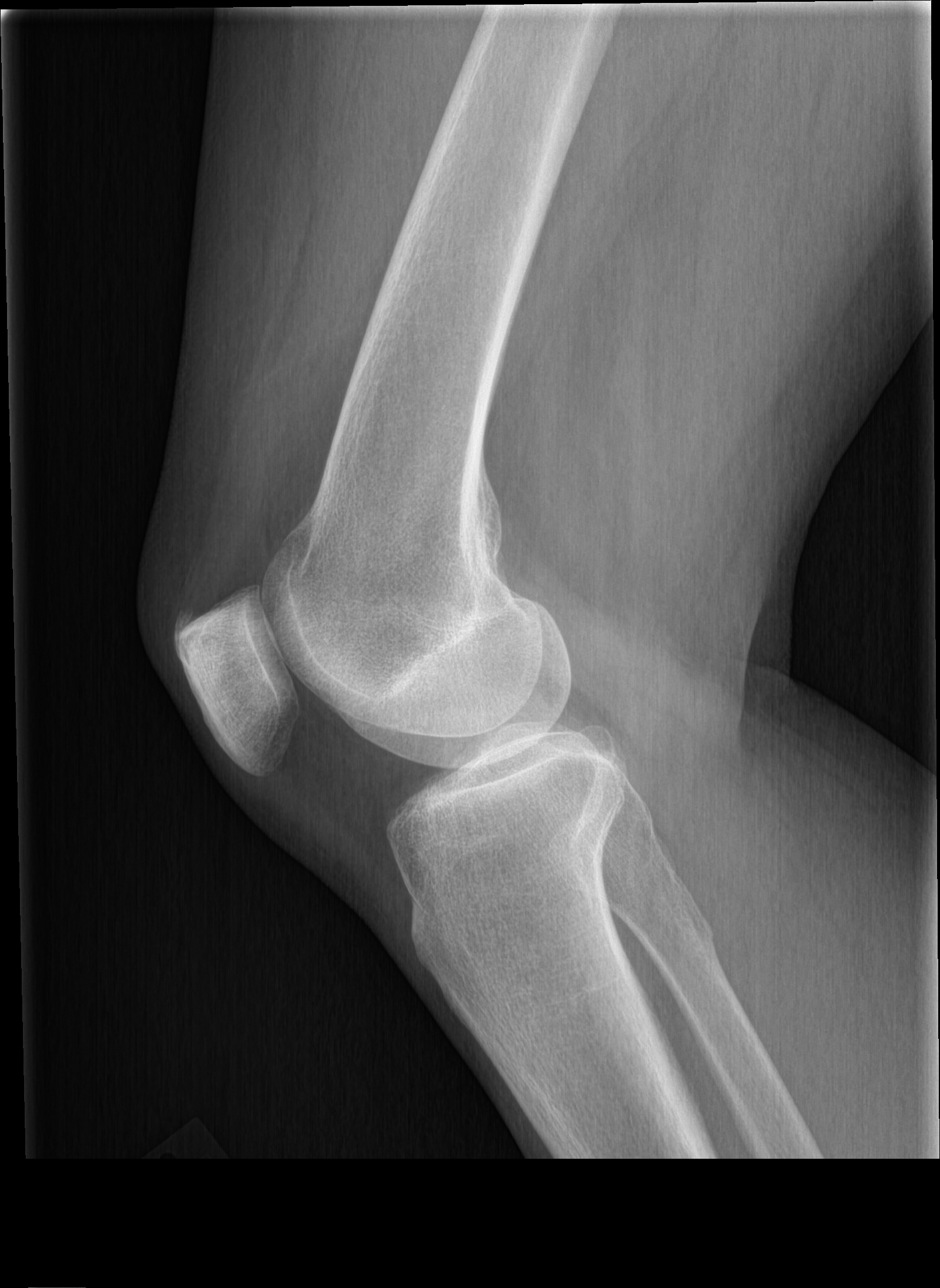

[4 of 4 positions shown; findings below may reference images not displayed]

FINDINGS: No evidence of fracture, dislocation, or joint effusion. No evidence
of arthropathy or other focal bone abnormality. Soft tissues are
unremarkable.
IMPRESSION: Negative.

## 2023-11-14 ENCOUNTER — Other Ambulatory Visit: Payer: Self-pay | Admitting: Family Medicine

## 2023-11-14 DIAGNOSIS — Z1231 Encounter for screening mammogram for malignant neoplasm of breast: Secondary | ICD-10-CM

## 2023-11-29 ENCOUNTER — Ambulatory Visit
Admission: RE | Admit: 2023-11-29 | Discharge: 2023-11-29 | Disposition: A | Discharge status: Home / Self Care | Source: Ambulatory Visit | Attending: Family Medicine | Admitting: Family Medicine

## 2023-11-29 DIAGNOSIS — Z1231 Encounter for screening mammogram for malignant neoplasm of breast: Secondary | ICD-10-CM

## 2024-02-06 ENCOUNTER — Encounter (HOSPITAL_BASED_OUTPATIENT_CLINIC_OR_DEPARTMENT_OTHER): Payer: Self-pay | Admitting: Emergency Medicine

## 2024-02-06 ENCOUNTER — Other Ambulatory Visit: Payer: Self-pay

## 2024-02-06 ENCOUNTER — Emergency Department (HOSPITAL_BASED_OUTPATIENT_CLINIC_OR_DEPARTMENT_OTHER)
Admission: EM | Admit: 2024-02-06 | Discharge: 2024-02-06 | Disposition: A | Discharge status: Home / Self Care | Attending: Emergency Medicine | Admitting: Emergency Medicine

## 2024-02-06 ENCOUNTER — Emergency Department (HOSPITAL_BASED_OUTPATIENT_CLINIC_OR_DEPARTMENT_OTHER)

## 2024-02-06 DIAGNOSIS — R1013 Epigastric pain: Secondary | ICD-10-CM | POA: Diagnosis present

## 2024-02-06 DIAGNOSIS — R319 Hematuria, unspecified: Secondary | ICD-10-CM | POA: Diagnosis not present

## 2024-02-06 DIAGNOSIS — R109 Unspecified abdominal pain: Secondary | ICD-10-CM

## 2024-02-06 DIAGNOSIS — E871 Hypo-osmolality and hyponatremia: Secondary | ICD-10-CM | POA: Insufficient documentation

## 2024-02-06 LAB — URINALYSIS, ROUTINE W REFLEX MICROSCOPIC
Bacteria, UA: NONE SEEN
Bilirubin Urine: NEGATIVE
Glucose, UA: NEGATIVE mg/dL
Ketones, ur: NEGATIVE mg/dL
Leukocytes,Ua: NEGATIVE
Nitrite: NEGATIVE
Protein, ur: NEGATIVE mg/dL
Specific Gravity, Urine: 1.005 (ref 1.005–1.030)
pH: 5.5 (ref 5.0–8.0)

## 2024-02-06 LAB — COMPREHENSIVE METABOLIC PANEL WITH GFR
ALT: 23 U/L (ref 0–44)
AST: 32 U/L (ref 15–41)
Albumin: 4.7 g/dL (ref 3.5–5.0)
Alkaline Phosphatase: 89 U/L (ref 38–126)
Anion gap: 14 (ref 5–15)
BUN: 6 mg/dL — ABNORMAL LOW (ref 8–23)
CO2: 22 mmol/L (ref 22–32)
Calcium: 9.7 mg/dL (ref 8.9–10.3)
Chloride: 89 mmol/L — ABNORMAL LOW (ref 98–111)
Creatinine, Ser: 0.57 mg/dL (ref 0.44–1.00)
GFR, Estimated: >60 mL/min (ref >60)
Glucose, Bld: 91 mg/dL (ref 70–99)
Potassium: 4 mmol/L (ref 3.5–5.1)
Sodium: 125 mmol/L — ABNORMAL LOW (ref 135–145)
Total Bilirubin: 0.4 mg/dL (ref 0.0–1.2)
Total Protein: 7.6 g/dL (ref 6.5–8.1)

## 2024-02-06 LAB — LIPASE, BLOOD: Lipase: 35 U/L (ref 11–51)

## 2024-02-06 LAB — CBC
HCT: 35.9 % — ABNORMAL LOW (ref 36.0–46.0)
Hemoglobin: 12.6 g/dL (ref 12.0–15.0)
MCH: 30.7 pg (ref 26.0–34.0)
MCHC: 35.1 g/dL (ref 30.0–36.0)
MCV: 87.6 fL (ref 80.0–100.0)
Platelets: 289 K/uL (ref 150–400)
RBC: 4.1 MIL/uL (ref 3.87–5.11)
RDW: 12.9 % (ref 11.5–15.5)
WBC: 6.5 K/uL (ref 4.0–10.5)
nRBC: 0 % (ref 0.0–0.2)

## 2024-02-06 MED ORDER — SODIUM CHLORIDE 0.9 % IV BOLUS
1000.0000 mL | Freq: Once | INTRAVENOUS | Status: AC
  Administered 2024-02-06: 1000 mL via INTRAVENOUS

## 2024-02-06 MED ORDER — ONDANSETRON HCL 4 MG/2ML IJ SOLN
4.0000 mg | Freq: Once | INTRAMUSCULAR | Status: AC
  Administered 2024-02-06: 4 mg via INTRAVENOUS
  Filled 2024-02-06: qty 2

## 2024-02-06 MED ORDER — KETOROLAC TROMETHAMINE 30 MG/ML IJ SOLN
15.0000 mg | Freq: Once | INTRAMUSCULAR | Status: AC
  Administered 2024-02-06: 15 mg via INTRAVENOUS
  Filled 2024-02-06: qty 1

## 2024-02-06 NOTE — ED Triage Notes (Signed)
 Pt via pov from home with generalized abdominal pain x 6 days and right flank pain x 4 days. Pt denies any urinary symptoms. Nothing makes it better or worse. Endorses nausea, denies emesis. Last BM yesterday. Pt has been taking laxatives and stool softeners for help with BMs. Pt a&o x 4; nad noted.

## 2024-02-06 NOTE — Discharge Instructions (Addendum)
 You were seen in the emergency room for abdominal pain and right flank pain Your blood work showed a low sodium We gave you fluids containing sodium You can increase your salt intake at home over the next week and follow-up with your primary care doctor next week to have repeat blood work drawn and a repeat urine test The urine test did show some blood in the urine but no evidence of urinary tract infection Based on the history you Metsker have passed a kidney stone We did not see kidney stones or other problems on the CAT scan Keep well-hydrated at home As discussed, use MiraLAX to help with the constipation See your primary care doctor within 1 week for reevaluation Return to the emergency room for severe pain or any other concerns

## 2024-02-06 NOTE — ED Provider Notes (Signed)
 [place] EMERGENCY DEPARTMENT AT [facility] Provider Note   CSN: [id number] Arrival date & time: 02/06/24  8167     Patient presents with: Abdominal Pain and Flank Pain   Monique Johnston is a 63 y.o. female.  With a history of alcohol use disorder in remission and fibromyalgia who presents to the ED for abdominal pain and flank pain.  1 week of epigastric abdominal pain.  Right flank pain started 4 days ago.  Associated nausea without vomiting.  Recent constipation.  Last BM was yesterday.  Has been using stool softeners and suppository at home.  No urinary symptoms.  No prior history of kidney stones in the past.  Denies fevers chills respiratory symptoms.  Last drink was over 20 years ago.  No other drug use.    Abdominal Pain Flank Pain Associated symptoms include abdominal pain.       Prior to Admission medications   Not on File    Allergies: Patient has no known allergies.    Review of Systems  Gastrointestinal:  Positive for abdominal pain.  Genitourinary:  Positive for flank pain.    Updated Vital Signs BP (!) 125/58   Pulse 67   Temp 98.6 F (37 C) (Oral)   Resp 17   Ht 5' (1.524 m)   Wt 56.7 kg   SpO2 100%   BMI 24.41 kg/m   Physical Exam Vitals and nursing note reviewed.  HENT:     Head: Normocephalic and atraumatic.  Eyes:     Pupils: Pupils are equal, round, and reactive to light.  Cardiovascular:     Rate and Rhythm: Normal rate and regular rhythm.  Pulmonary:     Effort: Pulmonary effort is normal.     Breath sounds: Normal breath sounds.  Abdominal:     Palpations: Abdomen is soft.     Tenderness: There is abdominal tenderness in the epigastric area. There is no right CVA tenderness or left CVA tenderness.     Comments: Mild epigastric tenderness without rebound rigidity or guarding  Skin:    General: Skin is warm and dry.  Neurological:     Mental Status: She is alert.  Psychiatric:        Mood and Affect: Mood normal.      (all labs ordered are listed, but only abnormal results are displayed) Labs Reviewed  COMPREHENSIVE METABOLIC PANEL WITH GFR - Abnormal; Notable for the following components:      Result Value   Sodium 125 (*)    Chloride 89 (*)    BUN 6 (*)    All other components within normal limits  CBC - Abnormal; Notable for the following components:   HCT 35.9 (*)    All other components within normal limits  URINALYSIS, ROUTINE W REFLEX MICROSCOPIC - Abnormal; Notable for the following components:   Color, Urine COLORLESS (*)    Hgb urine dipstick MODERATE (*)    All other components within normal limits  LIPASE, BLOOD    EKG: None  Radiology: CT Renal Stone Study Result Date: 02/06/2024 CLINICAL DATA:  Generalized abdominal pain and right flank pain, nausea EXAM: CT ABDOMEN AND PELVIS WITHOUT CONTRAST TECHNIQUE: Multidetector CT imaging of the abdomen and pelvis was performed following the standard protocol without IV contrast. RADIATION DOSE REDUCTION: This exam was performed according to the departmental dose-optimization program which includes automated exposure control, adjustment of the mA and/or kV according to patient size and/or use of iterative reconstruction technique. COMPARISON:  None  Available. FINDINGS: Lower chest: No acute pleural or parenchymal lung disease. Hepatobiliary: Unremarkable unenhanced appearance of the liver and gallbladder. No biliary duct dilation. Pancreas: Unremarkable unenhanced appearance. Spleen: Unremarkable unenhanced appearance. Adrenals/Urinary Tract: No urinary tract calculi or obstructive uropathy within either kidney. The adrenals and bladder are unremarkable. Stomach/Bowel: No bowel obstruction or ileus. Normal appendix right lower quadrant. No bowel wall thickening or inflammatory change. Vascular/Lymphatic: Aortic atherosclerosis. No enlarged abdominal or pelvic lymph nodes. Reproductive: Uterus and bilateral adnexa are unremarkable. Other: No free  fluid or free intraperitoneal gas. No abdominal wall hernia. Musculoskeletal: Bilateral hip osteoarthritis, right greater than left. No acute or destructive bony abnormalities. Reconstructed images demonstrate no additional findings. IMPRESSION: 1. Unremarkable unenhanced CT of the abdomen and pelvis. No urinary tract calculi or obstructive uropathy. 2.  Aortic Atherosclerosis (ICD10-I70.0). Electronically Signed   By: Ozell Daring M.D.   On: 02/06/2024 19:54     Procedures   Medications Ordered in the ED  sodium chloride  0.9 % bolus 1,000 mL (1,000 mLs Intravenous New Bag/Given 02/06/24 1954)  ondansetron  (ZOFRAN ) injection 4 mg (4 mg Intravenous Given 02/06/24 1955)  ketorolac  (TORADOL ) 30 MG/ML injection 15 mg (15 mg Intravenous Given 02/06/24 1957)    Clinical Course as of 02/06/24 2122  Tue Feb 06, 2024  2121 Laboratory workup notable for hyponatremia of 125.  She has had some mild hyponatremia in the past.  No headaches seizure activity.  UA shows no UTI but does show hemoglobinuria and red cells.  Renal function LFTs and lipase are within normal limits.  CT renal stone study shows no evidence of nephrolithiasis or other acute intra-abdominal pathology.  She Butler have passed a kidney stone.  No evidence of obstruction but her epigastric pain Santor be due to some constipation.  Instructed her on proper bowel regimen and need for PCP follow-up to recheck hyponatremia and hematuria. [MP]    Clinical Course User Index [MP] Pamella Ozell LABOR, DO                                 Medical Decision Making 63 year old female with history as above presented to ED for abdominal pain right flank pain x 1 week.  Afebrile well-appearing.  Some mild epigastric tenderness.  No other pertinent findings on physical exam.  Differential diagnosis includes: Acute intra-abdominal infectious/inflammatory process such as appendicitis, diverticulitis, pancreatitis and cholecystitis Constipation Urinary tract  infection Atypical presentation for pneumonia Viral gastroenteritis  Will obtain laboratory workup including CBC with differential, metabolic panel, lipase and urinalysis along with CT abdomen pelvis without contrast  Amount and/or Complexity of Data Reviewed Labs: ordered. Radiology: ordered.  Risk Prescription drug management.        Final diagnoses:  Abdominal pain, unspecified abdominal location  Right flank pain  Hematuria, unspecified type  Hyponatremia    ED Discharge Orders     None          Pamella Ozell LABOR, DO 02/06/24 2122

## 2024-02-06 NOTE — ED Notes (Signed)
 Patient transported to CT

## 2024-02-06 NOTE — ED Notes (Signed)
 Back from CT
